# Patient Record
Sex: Female | Born: 1945 | Race: White | Hispanic: No | Marital: Married | Smoking: Never smoker
Health system: Southern US, Community
[De-identification: ages and names within clinical notes are randomized; demographics above are authoritative.]

## PROBLEM LIST (undated history)

## (undated) DIAGNOSIS — R7303 Prediabetes: Secondary | ICD-10-CM

## (undated) DIAGNOSIS — J309 Allergic rhinitis, unspecified: Secondary | ICD-10-CM

## (undated) DIAGNOSIS — E785 Hyperlipidemia, unspecified: Secondary | ICD-10-CM

## (undated) DIAGNOSIS — C4402 Squamous cell carcinoma of skin of lip: Secondary | ICD-10-CM

## (undated) DIAGNOSIS — C4361 Malignant melanoma of right upper limb, including shoulder: Secondary | ICD-10-CM

## (undated) DIAGNOSIS — F988 Other specified behavioral and emotional disorders with onset usually occurring in childhood and adolescence: Secondary | ICD-10-CM

## (undated) DIAGNOSIS — I10 Essential (primary) hypertension: Secondary | ICD-10-CM

## (undated) DIAGNOSIS — M1990 Unspecified osteoarthritis, unspecified site: Secondary | ICD-10-CM

## (undated) DIAGNOSIS — F32A Depression, unspecified: Secondary | ICD-10-CM

## (undated) HISTORY — DX: Allergic rhinitis, unspecified: J30.9

## (undated) HISTORY — PX: OTHER SURGICAL HISTORY: SHX169

## (undated) HISTORY — DX: Squamous cell carcinoma of skin of lip: C44.02

## (undated) HISTORY — PX: MELANOMA EXCISION: SHX5266

## (undated) HISTORY — DX: Malignant melanoma of right upper limb, including shoulder: C43.61

## (undated) HISTORY — PX: TONSILLECTOMY: SUR1361

---

## 1999-06-03 ENCOUNTER — Other Ambulatory Visit: Admission: RE | Admit: 1999-06-03 | Discharge: 1999-06-03 | Payer: Self-pay | Admitting: *Deleted

## 2000-06-03 ENCOUNTER — Other Ambulatory Visit: Admission: RE | Admit: 2000-06-03 | Discharge: 2000-06-03 | Payer: Self-pay | Admitting: *Deleted

## 2003-04-11 ENCOUNTER — Other Ambulatory Visit: Admission: RE | Admit: 2003-04-11 | Discharge: 2003-04-11 | Payer: Self-pay | Admitting: *Deleted

## 2004-05-15 ENCOUNTER — Other Ambulatory Visit: Admission: RE | Admit: 2004-05-15 | Discharge: 2004-05-15 | Payer: Self-pay | Admitting: *Deleted

## 2005-12-08 ENCOUNTER — Other Ambulatory Visit: Admission: RE | Admit: 2005-12-08 | Discharge: 2005-12-08 | Payer: Self-pay | Admitting: *Deleted

## 2007-03-23 ENCOUNTER — Other Ambulatory Visit: Admission: RE | Admit: 2007-03-23 | Discharge: 2007-03-23 | Payer: Self-pay | Admitting: *Deleted

## 2015-05-15 DIAGNOSIS — J309 Allergic rhinitis, unspecified: Secondary | ICD-10-CM | POA: Insufficient documentation

## 2015-05-15 DIAGNOSIS — H1010 Acute atopic conjunctivitis, unspecified eye: Secondary | ICD-10-CM

## 2015-05-15 DIAGNOSIS — H04129 Dry eye syndrome of unspecified lacrimal gland: Secondary | ICD-10-CM | POA: Insufficient documentation

## 2015-05-15 DIAGNOSIS — I10 Essential (primary) hypertension: Secondary | ICD-10-CM

## 2015-06-15 ENCOUNTER — Ambulatory Visit (INDEPENDENT_AMBULATORY_CARE_PROVIDER_SITE_OTHER): Payer: Medicare Other | Admitting: *Deleted

## 2015-06-15 DIAGNOSIS — H1010 Acute atopic conjunctivitis, unspecified eye: Secondary | ICD-10-CM | POA: Diagnosis not present

## 2015-06-15 DIAGNOSIS — J309 Allergic rhinitis, unspecified: Secondary | ICD-10-CM | POA: Diagnosis not present

## 2015-06-22 ENCOUNTER — Ambulatory Visit (INDEPENDENT_AMBULATORY_CARE_PROVIDER_SITE_OTHER): Payer: Medicare Other | Admitting: *Deleted

## 2015-06-22 DIAGNOSIS — J309 Allergic rhinitis, unspecified: Secondary | ICD-10-CM

## 2015-06-29 ENCOUNTER — Ambulatory Visit (INDEPENDENT_AMBULATORY_CARE_PROVIDER_SITE_OTHER): Payer: Medicare Other | Admitting: *Deleted

## 2015-06-29 DIAGNOSIS — J309 Allergic rhinitis, unspecified: Secondary | ICD-10-CM

## 2015-07-02 DIAGNOSIS — J3089 Other allergic rhinitis: Secondary | ICD-10-CM | POA: Diagnosis not present

## 2015-07-03 DIAGNOSIS — J301 Allergic rhinitis due to pollen: Secondary | ICD-10-CM | POA: Diagnosis not present

## 2015-07-05 ENCOUNTER — Ambulatory Visit (INDEPENDENT_AMBULATORY_CARE_PROVIDER_SITE_OTHER): Payer: Medicare Other | Admitting: *Deleted

## 2015-07-05 DIAGNOSIS — J309 Allergic rhinitis, unspecified: Secondary | ICD-10-CM | POA: Diagnosis not present

## 2015-07-13 ENCOUNTER — Ambulatory Visit (INDEPENDENT_AMBULATORY_CARE_PROVIDER_SITE_OTHER): Payer: Medicare Other | Admitting: *Deleted

## 2015-07-13 DIAGNOSIS — J309 Allergic rhinitis, unspecified: Secondary | ICD-10-CM

## 2015-07-23 ENCOUNTER — Ambulatory Visit (INDEPENDENT_AMBULATORY_CARE_PROVIDER_SITE_OTHER): Payer: Medicare Other

## 2015-07-23 DIAGNOSIS — J309 Allergic rhinitis, unspecified: Secondary | ICD-10-CM

## 2015-07-30 ENCOUNTER — Ambulatory Visit (INDEPENDENT_AMBULATORY_CARE_PROVIDER_SITE_OTHER): Payer: Medicare Other

## 2015-07-30 DIAGNOSIS — J309 Allergic rhinitis, unspecified: Secondary | ICD-10-CM

## 2015-08-06 ENCOUNTER — Ambulatory Visit (INDEPENDENT_AMBULATORY_CARE_PROVIDER_SITE_OTHER): Payer: Medicare Other | Admitting: *Deleted

## 2015-08-06 DIAGNOSIS — J309 Allergic rhinitis, unspecified: Secondary | ICD-10-CM

## 2015-08-13 ENCOUNTER — Ambulatory Visit (INDEPENDENT_AMBULATORY_CARE_PROVIDER_SITE_OTHER): Payer: Medicare Other

## 2015-08-13 DIAGNOSIS — J309 Allergic rhinitis, unspecified: Secondary | ICD-10-CM | POA: Diagnosis not present

## 2015-08-24 ENCOUNTER — Ambulatory Visit (INDEPENDENT_AMBULATORY_CARE_PROVIDER_SITE_OTHER): Payer: Medicare Other | Admitting: *Deleted

## 2015-08-24 DIAGNOSIS — J309 Allergic rhinitis, unspecified: Secondary | ICD-10-CM

## 2015-09-03 ENCOUNTER — Ambulatory Visit (INDEPENDENT_AMBULATORY_CARE_PROVIDER_SITE_OTHER): Payer: Medicare Other | Admitting: *Deleted

## 2015-09-03 DIAGNOSIS — J309 Allergic rhinitis, unspecified: Secondary | ICD-10-CM | POA: Diagnosis not present

## 2015-09-13 ENCOUNTER — Ambulatory Visit (INDEPENDENT_AMBULATORY_CARE_PROVIDER_SITE_OTHER): Payer: Medicare Other

## 2015-09-13 DIAGNOSIS — J309 Allergic rhinitis, unspecified: Secondary | ICD-10-CM

## 2015-09-28 ENCOUNTER — Ambulatory Visit (INDEPENDENT_AMBULATORY_CARE_PROVIDER_SITE_OTHER): Payer: Medicare Other | Admitting: *Deleted

## 2015-09-28 DIAGNOSIS — J309 Allergic rhinitis, unspecified: Secondary | ICD-10-CM | POA: Diagnosis not present

## 2015-10-05 ENCOUNTER — Ambulatory Visit (INDEPENDENT_AMBULATORY_CARE_PROVIDER_SITE_OTHER): Payer: Medicare Other | Admitting: *Deleted

## 2015-10-05 DIAGNOSIS — J309 Allergic rhinitis, unspecified: Secondary | ICD-10-CM | POA: Diagnosis not present

## 2015-10-22 ENCOUNTER — Ambulatory Visit (INDEPENDENT_AMBULATORY_CARE_PROVIDER_SITE_OTHER): Payer: Medicare Other | Admitting: *Deleted

## 2015-10-22 DIAGNOSIS — J309 Allergic rhinitis, unspecified: Secondary | ICD-10-CM

## 2015-10-25 DIAGNOSIS — J301 Allergic rhinitis due to pollen: Secondary | ICD-10-CM | POA: Diagnosis not present

## 2015-10-26 DIAGNOSIS — J3089 Other allergic rhinitis: Secondary | ICD-10-CM | POA: Diagnosis not present

## 2015-10-29 ENCOUNTER — Ambulatory Visit (INDEPENDENT_AMBULATORY_CARE_PROVIDER_SITE_OTHER): Payer: Medicare Other

## 2015-10-29 DIAGNOSIS — J309 Allergic rhinitis, unspecified: Secondary | ICD-10-CM

## 2015-11-04 DIAGNOSIS — J310 Chronic rhinitis: Secondary | ICD-10-CM | POA: Insufficient documentation

## 2015-11-04 DIAGNOSIS — E785 Hyperlipidemia, unspecified: Secondary | ICD-10-CM | POA: Insufficient documentation

## 2015-11-04 DIAGNOSIS — R5382 Chronic fatigue, unspecified: Secondary | ICD-10-CM | POA: Insufficient documentation

## 2015-11-04 DIAGNOSIS — F411 Generalized anxiety disorder: Secondary | ICD-10-CM | POA: Insufficient documentation

## 2015-11-04 DIAGNOSIS — G4700 Insomnia, unspecified: Secondary | ICD-10-CM | POA: Insufficient documentation

## 2015-11-04 DIAGNOSIS — M159 Polyosteoarthritis, unspecified: Secondary | ICD-10-CM | POA: Insufficient documentation

## 2015-11-08 ENCOUNTER — Ambulatory Visit (INDEPENDENT_AMBULATORY_CARE_PROVIDER_SITE_OTHER): Payer: Medicare Other | Admitting: *Deleted

## 2015-11-08 DIAGNOSIS — J309 Allergic rhinitis, unspecified: Secondary | ICD-10-CM

## 2015-11-12 ENCOUNTER — Ambulatory Visit (INDEPENDENT_AMBULATORY_CARE_PROVIDER_SITE_OTHER): Payer: Medicare Other | Admitting: *Deleted

## 2015-11-12 DIAGNOSIS — J309 Allergic rhinitis, unspecified: Secondary | ICD-10-CM

## 2015-11-22 ENCOUNTER — Ambulatory Visit (INDEPENDENT_AMBULATORY_CARE_PROVIDER_SITE_OTHER): Payer: Medicare Other | Admitting: *Deleted

## 2015-11-22 DIAGNOSIS — J309 Allergic rhinitis, unspecified: Secondary | ICD-10-CM

## 2015-12-03 ENCOUNTER — Ambulatory Visit (INDEPENDENT_AMBULATORY_CARE_PROVIDER_SITE_OTHER): Payer: Medicare Other

## 2015-12-03 DIAGNOSIS — J309 Allergic rhinitis, unspecified: Secondary | ICD-10-CM

## 2015-12-10 ENCOUNTER — Ambulatory Visit (INDEPENDENT_AMBULATORY_CARE_PROVIDER_SITE_OTHER): Payer: Medicare Other

## 2015-12-10 DIAGNOSIS — J309 Allergic rhinitis, unspecified: Secondary | ICD-10-CM | POA: Diagnosis not present

## 2015-12-20 ENCOUNTER — Ambulatory Visit (INDEPENDENT_AMBULATORY_CARE_PROVIDER_SITE_OTHER): Payer: Medicare Other | Admitting: *Deleted

## 2015-12-20 DIAGNOSIS — J309 Allergic rhinitis, unspecified: Secondary | ICD-10-CM

## 2015-12-27 ENCOUNTER — Ambulatory Visit (INDEPENDENT_AMBULATORY_CARE_PROVIDER_SITE_OTHER): Payer: Medicare Other | Admitting: *Deleted

## 2015-12-27 DIAGNOSIS — J309 Allergic rhinitis, unspecified: Secondary | ICD-10-CM

## 2016-01-03 ENCOUNTER — Ambulatory Visit (INDEPENDENT_AMBULATORY_CARE_PROVIDER_SITE_OTHER): Payer: Medicare Other | Admitting: *Deleted

## 2016-01-03 DIAGNOSIS — J309 Allergic rhinitis, unspecified: Secondary | ICD-10-CM | POA: Diagnosis not present

## 2016-01-17 ENCOUNTER — Ambulatory Visit (INDEPENDENT_AMBULATORY_CARE_PROVIDER_SITE_OTHER): Payer: Medicare Other | Admitting: *Deleted

## 2016-01-17 DIAGNOSIS — J309 Allergic rhinitis, unspecified: Secondary | ICD-10-CM | POA: Diagnosis not present

## 2016-01-31 ENCOUNTER — Ambulatory Visit (INDEPENDENT_AMBULATORY_CARE_PROVIDER_SITE_OTHER): Payer: Medicare Other | Admitting: *Deleted

## 2016-01-31 DIAGNOSIS — J309 Allergic rhinitis, unspecified: Secondary | ICD-10-CM

## 2016-02-12 DIAGNOSIS — J301 Allergic rhinitis due to pollen: Secondary | ICD-10-CM | POA: Diagnosis not present

## 2016-02-13 DIAGNOSIS — J3089 Other allergic rhinitis: Secondary | ICD-10-CM | POA: Diagnosis not present

## 2016-02-18 ENCOUNTER — Ambulatory Visit (INDEPENDENT_AMBULATORY_CARE_PROVIDER_SITE_OTHER): Payer: Medicare Other

## 2016-02-18 DIAGNOSIS — J309 Allergic rhinitis, unspecified: Secondary | ICD-10-CM

## 2016-03-03 ENCOUNTER — Ambulatory Visit (INDEPENDENT_AMBULATORY_CARE_PROVIDER_SITE_OTHER): Payer: Medicare Other

## 2016-03-03 DIAGNOSIS — J309 Allergic rhinitis, unspecified: Secondary | ICD-10-CM

## 2016-03-06 DIAGNOSIS — H04129 Dry eye syndrome of unspecified lacrimal gland: Secondary | ICD-10-CM | POA: Insufficient documentation

## 2016-03-06 DIAGNOSIS — H0100B Unspecified blepharitis left eye, upper and lower eyelids: Secondary | ICD-10-CM | POA: Insufficient documentation

## 2016-03-06 DIAGNOSIS — H0100A Unspecified blepharitis right eye, upper and lower eyelids: Secondary | ICD-10-CM | POA: Insufficient documentation

## 2016-03-06 DIAGNOSIS — H25043 Posterior subcapsular polar age-related cataract, bilateral: Secondary | ICD-10-CM | POA: Insufficient documentation

## 2016-03-20 ENCOUNTER — Ambulatory Visit (INDEPENDENT_AMBULATORY_CARE_PROVIDER_SITE_OTHER): Payer: Medicare Other | Admitting: *Deleted

## 2016-03-20 DIAGNOSIS — J309 Allergic rhinitis, unspecified: Secondary | ICD-10-CM

## 2016-04-07 ENCOUNTER — Ambulatory Visit (INDEPENDENT_AMBULATORY_CARE_PROVIDER_SITE_OTHER): Payer: Medicare Other

## 2016-04-07 DIAGNOSIS — J309 Allergic rhinitis, unspecified: Secondary | ICD-10-CM

## 2016-04-17 ENCOUNTER — Ambulatory Visit (INDEPENDENT_AMBULATORY_CARE_PROVIDER_SITE_OTHER): Payer: Medicare Other | Admitting: *Deleted

## 2016-04-17 DIAGNOSIS — J309 Allergic rhinitis, unspecified: Secondary | ICD-10-CM

## 2016-04-21 DIAGNOSIS — H25012 Cortical age-related cataract, left eye: Secondary | ICD-10-CM | POA: Insufficient documentation

## 2016-04-21 DIAGNOSIS — H2512 Age-related nuclear cataract, left eye: Secondary | ICD-10-CM | POA: Insufficient documentation

## 2016-04-24 ENCOUNTER — Ambulatory Visit (INDEPENDENT_AMBULATORY_CARE_PROVIDER_SITE_OTHER): Payer: Medicare Other | Admitting: *Deleted

## 2016-04-24 DIAGNOSIS — J309 Allergic rhinitis, unspecified: Secondary | ICD-10-CM | POA: Diagnosis not present

## 2016-05-05 ENCOUNTER — Ambulatory Visit (INDEPENDENT_AMBULATORY_CARE_PROVIDER_SITE_OTHER): Payer: Medicare Other | Admitting: *Deleted

## 2016-05-05 DIAGNOSIS — J309 Allergic rhinitis, unspecified: Secondary | ICD-10-CM

## 2016-05-05 DIAGNOSIS — H43812 Vitreous degeneration, left eye: Secondary | ICD-10-CM | POA: Insufficient documentation

## 2016-05-14 DIAGNOSIS — G2581 Restless legs syndrome: Secondary | ICD-10-CM | POA: Insufficient documentation

## 2016-05-15 ENCOUNTER — Ambulatory Visit (INDEPENDENT_AMBULATORY_CARE_PROVIDER_SITE_OTHER): Payer: Medicare Other | Admitting: *Deleted

## 2016-05-15 DIAGNOSIS — J309 Allergic rhinitis, unspecified: Secondary | ICD-10-CM | POA: Diagnosis not present

## 2016-05-29 ENCOUNTER — Ambulatory Visit (INDEPENDENT_AMBULATORY_CARE_PROVIDER_SITE_OTHER): Payer: Medicare Other | Admitting: *Deleted

## 2016-05-29 DIAGNOSIS — J309 Allergic rhinitis, unspecified: Secondary | ICD-10-CM | POA: Diagnosis not present

## 2016-06-10 DIAGNOSIS — J301 Allergic rhinitis due to pollen: Secondary | ICD-10-CM | POA: Diagnosis not present

## 2016-06-11 DIAGNOSIS — J3089 Other allergic rhinitis: Secondary | ICD-10-CM | POA: Diagnosis not present

## 2016-06-12 ENCOUNTER — Ambulatory Visit (INDEPENDENT_AMBULATORY_CARE_PROVIDER_SITE_OTHER): Payer: Medicare Other | Admitting: *Deleted

## 2016-06-12 DIAGNOSIS — J309 Allergic rhinitis, unspecified: Secondary | ICD-10-CM | POA: Diagnosis not present

## 2016-06-16 DIAGNOSIS — H0288A Meibomian gland dysfunction right eye, upper and lower eyelids: Secondary | ICD-10-CM | POA: Insufficient documentation

## 2016-06-16 DIAGNOSIS — H26493 Other secondary cataract, bilateral: Secondary | ICD-10-CM | POA: Insufficient documentation

## 2016-06-23 ENCOUNTER — Ambulatory Visit (INDEPENDENT_AMBULATORY_CARE_PROVIDER_SITE_OTHER): Payer: Medicare Other | Admitting: *Deleted

## 2016-06-23 DIAGNOSIS — J309 Allergic rhinitis, unspecified: Secondary | ICD-10-CM | POA: Diagnosis not present

## 2016-07-07 ENCOUNTER — Ambulatory Visit (INDEPENDENT_AMBULATORY_CARE_PROVIDER_SITE_OTHER): Payer: Medicare Other | Admitting: *Deleted

## 2016-07-07 DIAGNOSIS — J309 Allergic rhinitis, unspecified: Secondary | ICD-10-CM | POA: Diagnosis not present

## 2016-07-17 ENCOUNTER — Ambulatory Visit (INDEPENDENT_AMBULATORY_CARE_PROVIDER_SITE_OTHER): Payer: Medicare Other | Admitting: *Deleted

## 2016-07-17 DIAGNOSIS — J309 Allergic rhinitis, unspecified: Secondary | ICD-10-CM

## 2016-07-24 ENCOUNTER — Ambulatory Visit (INDEPENDENT_AMBULATORY_CARE_PROVIDER_SITE_OTHER): Payer: Medicare Other | Admitting: *Deleted

## 2016-07-24 DIAGNOSIS — J309 Allergic rhinitis, unspecified: Secondary | ICD-10-CM | POA: Diagnosis not present

## 2016-08-04 ENCOUNTER — Ambulatory Visit (INDEPENDENT_AMBULATORY_CARE_PROVIDER_SITE_OTHER): Payer: Medicare Other | Admitting: *Deleted

## 2016-08-04 DIAGNOSIS — J309 Allergic rhinitis, unspecified: Secondary | ICD-10-CM | POA: Diagnosis not present

## 2016-08-11 ENCOUNTER — Ambulatory Visit (INDEPENDENT_AMBULATORY_CARE_PROVIDER_SITE_OTHER): Payer: Medicare Other | Admitting: *Deleted

## 2016-08-11 DIAGNOSIS — J309 Allergic rhinitis, unspecified: Secondary | ICD-10-CM | POA: Diagnosis not present

## 2016-08-21 ENCOUNTER — Ambulatory Visit (INDEPENDENT_AMBULATORY_CARE_PROVIDER_SITE_OTHER): Payer: Medicare Other

## 2016-08-21 DIAGNOSIS — J309 Allergic rhinitis, unspecified: Secondary | ICD-10-CM

## 2016-09-04 ENCOUNTER — Ambulatory Visit (INDEPENDENT_AMBULATORY_CARE_PROVIDER_SITE_OTHER): Payer: Medicare Other | Admitting: *Deleted

## 2016-09-04 DIAGNOSIS — J309 Allergic rhinitis, unspecified: Secondary | ICD-10-CM

## 2016-09-11 ENCOUNTER — Ambulatory Visit (INDEPENDENT_AMBULATORY_CARE_PROVIDER_SITE_OTHER): Payer: Medicare Other | Admitting: *Deleted

## 2016-09-11 DIAGNOSIS — J309 Allergic rhinitis, unspecified: Secondary | ICD-10-CM | POA: Diagnosis not present

## 2016-09-25 ENCOUNTER — Ambulatory Visit (INDEPENDENT_AMBULATORY_CARE_PROVIDER_SITE_OTHER): Payer: Medicare Other | Admitting: *Deleted

## 2016-09-25 DIAGNOSIS — J309 Allergic rhinitis, unspecified: Secondary | ICD-10-CM

## 2016-10-02 DIAGNOSIS — J301 Allergic rhinitis due to pollen: Secondary | ICD-10-CM | POA: Diagnosis not present

## 2016-10-03 DIAGNOSIS — J3089 Other allergic rhinitis: Secondary | ICD-10-CM | POA: Diagnosis not present

## 2016-10-13 ENCOUNTER — Ambulatory Visit (INDEPENDENT_AMBULATORY_CARE_PROVIDER_SITE_OTHER): Payer: Medicare Other | Admitting: *Deleted

## 2016-10-13 DIAGNOSIS — J309 Allergic rhinitis, unspecified: Secondary | ICD-10-CM

## 2016-10-30 ENCOUNTER — Ambulatory Visit (INDEPENDENT_AMBULATORY_CARE_PROVIDER_SITE_OTHER): Payer: Medicare Other | Admitting: *Deleted

## 2016-10-30 DIAGNOSIS — J309 Allergic rhinitis, unspecified: Secondary | ICD-10-CM | POA: Diagnosis not present

## 2016-11-20 ENCOUNTER — Ambulatory Visit (INDEPENDENT_AMBULATORY_CARE_PROVIDER_SITE_OTHER): Payer: Medicare Other | Admitting: *Deleted

## 2016-11-20 DIAGNOSIS — J309 Allergic rhinitis, unspecified: Secondary | ICD-10-CM | POA: Diagnosis not present

## 2016-12-04 ENCOUNTER — Ambulatory Visit (INDEPENDENT_AMBULATORY_CARE_PROVIDER_SITE_OTHER): Payer: Medicare Other | Admitting: *Deleted

## 2016-12-04 DIAGNOSIS — J309 Allergic rhinitis, unspecified: Secondary | ICD-10-CM | POA: Diagnosis not present

## 2016-12-29 ENCOUNTER — Ambulatory Visit (INDEPENDENT_AMBULATORY_CARE_PROVIDER_SITE_OTHER): Payer: Medicare Other | Admitting: *Deleted

## 2016-12-29 DIAGNOSIS — J309 Allergic rhinitis, unspecified: Secondary | ICD-10-CM

## 2017-01-15 ENCOUNTER — Ambulatory Visit (INDEPENDENT_AMBULATORY_CARE_PROVIDER_SITE_OTHER): Payer: Medicare Other | Admitting: *Deleted

## 2017-01-15 DIAGNOSIS — J309 Allergic rhinitis, unspecified: Secondary | ICD-10-CM | POA: Diagnosis not present

## 2017-02-02 ENCOUNTER — Ambulatory Visit (INDEPENDENT_AMBULATORY_CARE_PROVIDER_SITE_OTHER): Payer: Medicare Other | Admitting: *Deleted

## 2017-02-02 DIAGNOSIS — J309 Allergic rhinitis, unspecified: Secondary | ICD-10-CM | POA: Diagnosis not present

## 2017-02-19 ENCOUNTER — Ambulatory Visit (INDEPENDENT_AMBULATORY_CARE_PROVIDER_SITE_OTHER): Payer: Medicare Other | Admitting: *Deleted

## 2017-02-19 DIAGNOSIS — J309 Allergic rhinitis, unspecified: Secondary | ICD-10-CM | POA: Diagnosis not present

## 2017-03-05 ENCOUNTER — Ambulatory Visit (INDEPENDENT_AMBULATORY_CARE_PROVIDER_SITE_OTHER): Payer: Medicare Other

## 2017-03-05 DIAGNOSIS — J309 Allergic rhinitis, unspecified: Secondary | ICD-10-CM | POA: Diagnosis not present

## 2017-03-19 ENCOUNTER — Ambulatory Visit (INDEPENDENT_AMBULATORY_CARE_PROVIDER_SITE_OTHER): Payer: Medicare Other

## 2017-03-19 DIAGNOSIS — J309 Allergic rhinitis, unspecified: Secondary | ICD-10-CM

## 2017-03-27 NOTE — Addendum Note (Signed)
Addended by: Berna BueWHITAKER, Morrigan Wickens L on: 03/27/2017 03:58 PM   Modules accepted: Orders

## 2017-04-06 ENCOUNTER — Ambulatory Visit (INDEPENDENT_AMBULATORY_CARE_PROVIDER_SITE_OTHER): Payer: Medicare Other

## 2017-04-06 DIAGNOSIS — J309 Allergic rhinitis, unspecified: Secondary | ICD-10-CM | POA: Diagnosis not present

## 2017-04-07 DIAGNOSIS — J3089 Other allergic rhinitis: Secondary | ICD-10-CM | POA: Diagnosis not present

## 2017-04-07 NOTE — Progress Notes (Signed)
Vials exp 04-07-18 

## 2017-04-08 DIAGNOSIS — J301 Allergic rhinitis due to pollen: Secondary | ICD-10-CM | POA: Diagnosis not present

## 2017-04-20 ENCOUNTER — Ambulatory Visit (INDEPENDENT_AMBULATORY_CARE_PROVIDER_SITE_OTHER): Payer: Medicare Other | Admitting: *Deleted

## 2017-04-20 DIAGNOSIS — J309 Allergic rhinitis, unspecified: Secondary | ICD-10-CM

## 2017-05-11 ENCOUNTER — Ambulatory Visit (INDEPENDENT_AMBULATORY_CARE_PROVIDER_SITE_OTHER): Payer: Medicare Other | Admitting: *Deleted

## 2017-05-11 DIAGNOSIS — J309 Allergic rhinitis, unspecified: Secondary | ICD-10-CM

## 2017-05-25 ENCOUNTER — Ambulatory Visit (INDEPENDENT_AMBULATORY_CARE_PROVIDER_SITE_OTHER): Payer: Medicare Other | Admitting: *Deleted

## 2017-05-25 DIAGNOSIS — J309 Allergic rhinitis, unspecified: Secondary | ICD-10-CM | POA: Diagnosis not present

## 2017-06-15 ENCOUNTER — Ambulatory Visit (INDEPENDENT_AMBULATORY_CARE_PROVIDER_SITE_OTHER): Payer: Medicare Other | Admitting: *Deleted

## 2017-06-15 DIAGNOSIS — J309 Allergic rhinitis, unspecified: Secondary | ICD-10-CM | POA: Diagnosis not present

## 2017-07-09 ENCOUNTER — Ambulatory Visit (INDEPENDENT_AMBULATORY_CARE_PROVIDER_SITE_OTHER): Payer: Medicare Other | Admitting: *Deleted

## 2017-07-09 DIAGNOSIS — J309 Allergic rhinitis, unspecified: Secondary | ICD-10-CM | POA: Diagnosis not present

## 2017-08-03 ENCOUNTER — Ambulatory Visit (INDEPENDENT_AMBULATORY_CARE_PROVIDER_SITE_OTHER): Payer: Medicare Other

## 2017-08-03 DIAGNOSIS — J309 Allergic rhinitis, unspecified: Secondary | ICD-10-CM | POA: Diagnosis not present

## 2017-08-27 ENCOUNTER — Ambulatory Visit (INDEPENDENT_AMBULATORY_CARE_PROVIDER_SITE_OTHER): Payer: Medicare Other | Admitting: *Deleted

## 2017-08-27 DIAGNOSIS — J309 Allergic rhinitis, unspecified: Secondary | ICD-10-CM | POA: Diagnosis not present

## 2017-10-19 ENCOUNTER — Ambulatory Visit (INDEPENDENT_AMBULATORY_CARE_PROVIDER_SITE_OTHER): Payer: Medicare Other | Admitting: *Deleted

## 2017-10-19 DIAGNOSIS — J309 Allergic rhinitis, unspecified: Secondary | ICD-10-CM

## 2017-10-22 DIAGNOSIS — M25552 Pain in left hip: Secondary | ICD-10-CM | POA: Insufficient documentation

## 2017-11-02 ENCOUNTER — Ambulatory Visit (INDEPENDENT_AMBULATORY_CARE_PROVIDER_SITE_OTHER): Payer: Medicare Other | Admitting: *Deleted

## 2017-11-02 DIAGNOSIS — J309 Allergic rhinitis, unspecified: Secondary | ICD-10-CM

## 2017-11-19 ENCOUNTER — Ambulatory Visit (INDEPENDENT_AMBULATORY_CARE_PROVIDER_SITE_OTHER): Payer: Medicare Other | Admitting: *Deleted

## 2017-11-19 DIAGNOSIS — J309 Allergic rhinitis, unspecified: Secondary | ICD-10-CM

## 2017-11-20 DIAGNOSIS — M7062 Trochanteric bursitis, left hip: Secondary | ICD-10-CM | POA: Insufficient documentation

## 2017-12-02 ENCOUNTER — Ambulatory Visit (INDEPENDENT_AMBULATORY_CARE_PROVIDER_SITE_OTHER): Payer: Medicare Other

## 2017-12-02 ENCOUNTER — Encounter: Payer: Self-pay | Admitting: Sports Medicine

## 2017-12-02 ENCOUNTER — Ambulatory Visit: Payer: Medicare Other | Admitting: Sports Medicine

## 2017-12-02 DIAGNOSIS — M2040 Other hammer toe(s) (acquired), unspecified foot: Secondary | ICD-10-CM | POA: Diagnosis not present

## 2017-12-02 DIAGNOSIS — M79671 Pain in right foot: Secondary | ICD-10-CM

## 2017-12-02 DIAGNOSIS — M79672 Pain in left foot: Secondary | ICD-10-CM | POA: Diagnosis not present

## 2017-12-02 NOTE — Progress Notes (Signed)
Subjective: Tricia Hayes is a 72 y.o. female patient who presents to office for evaluation of Right> Left foot pain/irritation. Patient complains of progressive changes to toe especially over the last few years even though has noticed this problem for several years slowly getting worse in the Right>Left foot at the 2-5 toes.  States that the area is not painful it is just irritating patient is unable to give pain level. Reports that she is now even getting tenderness on the ball of her foot from time to time reports that her over-the-counter and padding helps.  Patient denies any other pedal complaints.   Review of Systems  All other systems reviewed and are negative.    Patient Active Problem List   Diagnosis Date Noted  . Dry eye syndrome 05/15/2015  . SystemiC arterial hypertension 05/15/2015  . Allergic rhinoconjunctivitis 05/15/2015    Current Outpatient Medications on File Prior to Visit  Medication Sig Dispense Refill  . aspirin 81 MG chewable tablet Chew by mouth daily.    Marland Kitchen. atorvastatin (LIPITOR) 10 MG tablet Take 10 mg by mouth daily.    . bisoprolol-hydrochlorothiazide (ZIAC) 5-6.25 MG tablet Take 1 tablet by mouth daily.    Marland Kitchen. buPROPion (WELLBUTRIN XL) 300 MG 24 hr tablet Take 300 mg by mouth daily.    . cetirizine (ZYRTEC) 10 MG tablet Take 10 mg by mouth daily.    Marland Kitchen. dextromethorphan 15 MG/5ML syrup Take 10 mLs by mouth 4 (four) times daily as needed for cough.    Marland Kitchen. FLUoxetine (PROZAC) 20 MG tablet Take 20 mg by mouth daily.    . fluticasone (FLONASE) 50 MCG/ACT nasal spray Place 1 spray into both nostrils daily.    Marland Kitchen. MONTELUKAST SODIUM PO Take by mouth daily.    . Zolpidem Tartrate (AMBIEN PO) Take by mouth at bedtime.     No current facility-administered medications on file prior to visit.     Allergies  Allergen Reactions  . Penicillins     Objective:  General: Alert and oriented x3 in no acute distress  Dermatology: Minimal hyperkeratotic lesion overlying 2-5  PIPJ dorsally bilateral. No open lesions bilateral lower extremities, no webspace macerations, no ecchymosis bilateral, all nails x 10 are well manicured.  Vascular: Dorsalis Pedis and Posterior Tibial pedal pulses 1/4, Capillary Fill Time 3 seconds,(+) pedal hair growth bilateral, no edema bilateral lower extremities, Temperature gradient within normal limits.  Neurology: Michaell CowingGross sensation intact via light touch bilateral.   Musculoskeletal: Semi-flexible hammertoes 2-5 with minimal tenderness with palpation at PIPJ Right>Left. Ankle, Subtalar, Midtarsal, and MTPJ joint range of motion is within normal limits, there is no 1st ray hypermobility noted bilateral, No bunion deformity noted bilateral. No pain with calf compression bilateral.  Strength within normal limits in all groups bilateral.   Gait: Unassisted, Non-antalgic.  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       Assessment and Plan: Problem List Items Addressed This Visit    None    Visit Diagnoses    Hammer toe, unspecified laterality    -  Primary   Relevant Orders   DG Foot Complete Right   DG Foot Complete Left   Foot pain, bilateral           -Complete examination performed -Xrays reviewed -Discussed treatement options for hammertoe deformity with dislocation -Dispensed toe separator and encouraged patient to continue with good supportive shoes and gave shoe recommendation  list -Advised patient if continues to worsen or fail to get an additional relief and may benefit from surgery -Patient to return to office as needed or sooner if condition worsens.  Asencion Islam, DPM

## 2017-12-02 NOTE — Patient Instructions (Signed)
Hammer Toe Hammer toe is a change in the shape (a deformity) of your second, third, or fourth toe. The deformity causes the middle joint of your toe to stay bent. This causes pain, especially when you are wearing shoes. Hammer toe starts gradually. At first, the toe can be straightened. Gradually over time, the deformity becomes stiff and permanent. Early treatments to keep the toe straight may relieve pain. As the deformity becomes stiff and permanent, surgery may be needed to straighten the toe. What are the causes? Hammer toe is caused by abnormal bending of the toe joint that is closest to your foot. It happens gradually over time. This pulls on the muscles and connections (tendons) of the toe joint, making them weak and stiff. It is often related to wearing shoes that are too short or narrow and do not let your toes straighten. What increases the risk? You may be at greater risk for hammer toe if you:  Are female.  Are older.  Wear shoes that are too small.  Wear high-heeled shoes that pinch your toes.  Are a ballet dancer.  Have a second toe that is longer than your big toe (first toe).  Injure your foot or toe.  Have arthritis.  Have a family history of hammer toe.  Have a nerve or muscle disorder.  What are the signs or symptoms? The main symptoms of this condition are pain and deformity of the toe. The pain is worse when wearing shoes, walking, or running. Other symptoms may include:  Corns or calluses over the bent part of the toe or between the toes.  Redness and a burning feeling on the toe.  An open sore that forms on the top of the toe.  Not being able to straighten the toe.  How is this diagnosed? This condition is diagnosed based on your symptoms and a physical exam. During the exam, your health care provider will try to straighten your toe to see how stiff the deformity is. You may also have tests, such as:  A blood test to check for rheumatoid  arthritis.  An X-ray to show how severe the deformity is.  How is this treated? Treatment for this condition will depend on how stiff the deformity is. Surgery is often needed. However, sometimes a hammer toe can be straightened without surgery. Treatments that do not involve surgery include:  Taping the toe into a straightened position.  Using pads and cushions to protect the toe (orthotics).  Wearing shoes that provide enough room for the toes.  Doing toe-stretching exercises at home.  Taking an NSAID to reduce pain and swelling.  If these treatments do not help or the toe cannot be straightened, surgery is the next option. The most common surgeries used to straighten a hammer toe include:  Arthroplasty. In this procedure, part of the joint is removed, and that allows the toe to straighten.  Fusion. In this procedure, cartilage between the two bones of the joint is taken out and the bones are fused together into one longer bone.  Implantation. In this procedure, part of the bone is removed and replaced with an implant to let the toe move again.  Flexor tendon transfer. In this procedure, the tendons that curl the toes down (flexor tendons) are repositioned.  Follow these instructions at home:  Take over-the-counter and prescription medicines only as told by your health care provider.  Do toe straightening and stretching exercises as told by your health care provider.  Keep all   follow-up visits as told by your health care provider. This is important. How is this prevented?  Wear shoes that give your toes enough room and do not cause pain.  Do not wear high-heeled shoes. Contact a health care provider if:  Your pain gets worse.  Your toe becomes red or swollen.  You develop an open sore on your toe. This information is not intended to replace advice given to you by your health care provider. Make sure you discuss any questions you have with your health care  provider. Document Released: 08/29/2000 Document Revised: 03/21/2016 Document Reviewed: 12/26/2015 Elsevier Interactive Patient Education  2018 [employer]  

## 2017-12-07 ENCOUNTER — Ambulatory Visit (INDEPENDENT_AMBULATORY_CARE_PROVIDER_SITE_OTHER): Payer: Medicare Other | Admitting: *Deleted

## 2017-12-07 DIAGNOSIS — J309 Allergic rhinitis, unspecified: Secondary | ICD-10-CM

## 2017-12-28 ENCOUNTER — Ambulatory Visit (INDEPENDENT_AMBULATORY_CARE_PROVIDER_SITE_OTHER): Payer: Medicare Other | Admitting: *Deleted

## 2017-12-28 DIAGNOSIS — J309 Allergic rhinitis, unspecified: Secondary | ICD-10-CM | POA: Diagnosis not present

## 2018-01-11 ENCOUNTER — Ambulatory Visit (INDEPENDENT_AMBULATORY_CARE_PROVIDER_SITE_OTHER): Payer: Medicare Other | Admitting: *Deleted

## 2018-01-11 DIAGNOSIS — J309 Allergic rhinitis, unspecified: Secondary | ICD-10-CM | POA: Diagnosis not present

## 2018-01-12 NOTE — Progress Notes (Signed)
VIALS EXP 01-14-19 

## 2018-01-13 DIAGNOSIS — J301 Allergic rhinitis due to pollen: Secondary | ICD-10-CM | POA: Diagnosis not present

## 2018-01-14 DIAGNOSIS — J3089 Other allergic rhinitis: Secondary | ICD-10-CM | POA: Diagnosis not present

## 2018-01-25 ENCOUNTER — Ambulatory Visit (INDEPENDENT_AMBULATORY_CARE_PROVIDER_SITE_OTHER): Payer: Medicare Other | Admitting: *Deleted

## 2018-01-25 DIAGNOSIS — J309 Allergic rhinitis, unspecified: Secondary | ICD-10-CM | POA: Diagnosis not present

## 2018-02-11 ENCOUNTER — Ambulatory Visit (INDEPENDENT_AMBULATORY_CARE_PROVIDER_SITE_OTHER): Payer: Medicare Other | Admitting: *Deleted

## 2018-02-11 DIAGNOSIS — J309 Allergic rhinitis, unspecified: Secondary | ICD-10-CM

## 2018-02-24 DIAGNOSIS — F331 Major depressive disorder, recurrent, moderate: Secondary | ICD-10-CM | POA: Insufficient documentation

## 2018-03-01 DIAGNOSIS — R5381 Other malaise: Secondary | ICD-10-CM | POA: Insufficient documentation

## 2018-03-01 DIAGNOSIS — G894 Chronic pain syndrome: Secondary | ICD-10-CM | POA: Insufficient documentation

## 2018-03-03 DIAGNOSIS — R7303 Prediabetes: Secondary | ICD-10-CM | POA: Insufficient documentation

## 2018-03-04 ENCOUNTER — Ambulatory Visit (INDEPENDENT_AMBULATORY_CARE_PROVIDER_SITE_OTHER): Payer: Medicare Other | Admitting: *Deleted

## 2018-03-04 DIAGNOSIS — J309 Allergic rhinitis, unspecified: Secondary | ICD-10-CM | POA: Diagnosis not present

## 2018-03-25 ENCOUNTER — Ambulatory Visit (INDEPENDENT_AMBULATORY_CARE_PROVIDER_SITE_OTHER): Payer: Medicare Other | Admitting: *Deleted

## 2018-03-25 DIAGNOSIS — J309 Allergic rhinitis, unspecified: Secondary | ICD-10-CM

## 2018-04-16 DIAGNOSIS — F988 Other specified behavioral and emotional disorders with onset usually occurring in childhood and adolescence: Secondary | ICD-10-CM | POA: Insufficient documentation

## 2018-04-19 ENCOUNTER — Ambulatory Visit (INDEPENDENT_AMBULATORY_CARE_PROVIDER_SITE_OTHER): Payer: Medicare Other | Admitting: *Deleted

## 2018-04-19 DIAGNOSIS — J309 Allergic rhinitis, unspecified: Secondary | ICD-10-CM

## 2018-05-03 ENCOUNTER — Ambulatory Visit (INDEPENDENT_AMBULATORY_CARE_PROVIDER_SITE_OTHER): Payer: Medicare Other | Admitting: *Deleted

## 2018-05-03 DIAGNOSIS — J309 Allergic rhinitis, unspecified: Secondary | ICD-10-CM

## 2018-05-31 ENCOUNTER — Ambulatory Visit (INDEPENDENT_AMBULATORY_CARE_PROVIDER_SITE_OTHER): Payer: Medicare Other | Admitting: *Deleted

## 2018-05-31 DIAGNOSIS — J309 Allergic rhinitis, unspecified: Secondary | ICD-10-CM

## 2018-06-17 ENCOUNTER — Ambulatory Visit (INDEPENDENT_AMBULATORY_CARE_PROVIDER_SITE_OTHER): Payer: Medicare Other | Admitting: *Deleted

## 2018-06-17 DIAGNOSIS — J309 Allergic rhinitis, unspecified: Secondary | ICD-10-CM

## 2018-07-19 ENCOUNTER — Ambulatory Visit (INDEPENDENT_AMBULATORY_CARE_PROVIDER_SITE_OTHER): Payer: Medicare Other | Admitting: *Deleted

## 2018-07-19 DIAGNOSIS — J309 Allergic rhinitis, unspecified: Secondary | ICD-10-CM

## 2018-08-16 ENCOUNTER — Ambulatory Visit (INDEPENDENT_AMBULATORY_CARE_PROVIDER_SITE_OTHER): Payer: Medicare Other | Admitting: *Deleted

## 2018-08-16 DIAGNOSIS — J309 Allergic rhinitis, unspecified: Secondary | ICD-10-CM

## 2018-09-20 ENCOUNTER — Ambulatory Visit (INDEPENDENT_AMBULATORY_CARE_PROVIDER_SITE_OTHER): Payer: Medicare Other | Admitting: *Deleted

## 2018-09-20 DIAGNOSIS — J309 Allergic rhinitis, unspecified: Secondary | ICD-10-CM | POA: Diagnosis not present

## 2018-10-04 ENCOUNTER — Ambulatory Visit (INDEPENDENT_AMBULATORY_CARE_PROVIDER_SITE_OTHER): Payer: Medicare Other

## 2018-10-04 DIAGNOSIS — J309 Allergic rhinitis, unspecified: Secondary | ICD-10-CM

## 2018-10-25 ENCOUNTER — Ambulatory Visit (INDEPENDENT_AMBULATORY_CARE_PROVIDER_SITE_OTHER): Payer: Medicare Other

## 2018-10-25 DIAGNOSIS — J309 Allergic rhinitis, unspecified: Secondary | ICD-10-CM

## 2018-11-09 ENCOUNTER — Encounter: Payer: Self-pay | Admitting: *Deleted

## 2018-11-09 DIAGNOSIS — J301 Allergic rhinitis due to pollen: Secondary | ICD-10-CM | POA: Diagnosis not present

## 2018-11-09 NOTE — Progress Notes (Signed)
VIALS EXP. 11/10/2019 

## 2018-11-10 DIAGNOSIS — J3089 Other allergic rhinitis: Secondary | ICD-10-CM | POA: Diagnosis not present

## 2018-11-11 ENCOUNTER — Ambulatory Visit (INDEPENDENT_AMBULATORY_CARE_PROVIDER_SITE_OTHER): Payer: Medicare Other | Admitting: *Deleted

## 2018-11-11 DIAGNOSIS — J309 Allergic rhinitis, unspecified: Secondary | ICD-10-CM | POA: Diagnosis not present

## 2018-12-02 ENCOUNTER — Ambulatory Visit (INDEPENDENT_AMBULATORY_CARE_PROVIDER_SITE_OTHER): Payer: Medicare Other | Admitting: *Deleted

## 2018-12-02 DIAGNOSIS — J309 Allergic rhinitis, unspecified: Secondary | ICD-10-CM | POA: Diagnosis not present

## 2018-12-16 ENCOUNTER — Ambulatory Visit (INDEPENDENT_AMBULATORY_CARE_PROVIDER_SITE_OTHER): Payer: Medicare Other | Admitting: *Deleted

## 2018-12-16 DIAGNOSIS — J309 Allergic rhinitis, unspecified: Secondary | ICD-10-CM | POA: Diagnosis not present

## 2018-12-30 ENCOUNTER — Ambulatory Visit (INDEPENDENT_AMBULATORY_CARE_PROVIDER_SITE_OTHER): Payer: Medicare Other | Admitting: *Deleted

## 2018-12-30 DIAGNOSIS — J309 Allergic rhinitis, unspecified: Secondary | ICD-10-CM | POA: Diagnosis not present

## 2019-01-24 ENCOUNTER — Ambulatory Visit (INDEPENDENT_AMBULATORY_CARE_PROVIDER_SITE_OTHER): Payer: Medicare Other | Admitting: *Deleted

## 2019-01-24 DIAGNOSIS — J309 Allergic rhinitis, unspecified: Secondary | ICD-10-CM

## 2019-02-14 ENCOUNTER — Ambulatory Visit (INDEPENDENT_AMBULATORY_CARE_PROVIDER_SITE_OTHER): Payer: Medicare Other | Admitting: *Deleted

## 2019-02-14 DIAGNOSIS — J309 Allergic rhinitis, unspecified: Secondary | ICD-10-CM

## 2019-03-10 ENCOUNTER — Ambulatory Visit (INDEPENDENT_AMBULATORY_CARE_PROVIDER_SITE_OTHER): Payer: Medicare Other

## 2019-03-10 DIAGNOSIS — J309 Allergic rhinitis, unspecified: Secondary | ICD-10-CM

## 2019-03-31 ENCOUNTER — Ambulatory Visit (INDEPENDENT_AMBULATORY_CARE_PROVIDER_SITE_OTHER): Payer: Medicare Other | Admitting: *Deleted

## 2019-03-31 DIAGNOSIS — J309 Allergic rhinitis, unspecified: Secondary | ICD-10-CM

## 2019-04-28 ENCOUNTER — Ambulatory Visit (INDEPENDENT_AMBULATORY_CARE_PROVIDER_SITE_OTHER): Payer: Medicare Other | Admitting: *Deleted

## 2019-04-28 DIAGNOSIS — J309 Allergic rhinitis, unspecified: Secondary | ICD-10-CM

## 2019-05-02 DIAGNOSIS — M79641 Pain in right hand: Secondary | ICD-10-CM | POA: Insufficient documentation

## 2019-05-02 DIAGNOSIS — M659 Synovitis and tenosynovitis, unspecified: Secondary | ICD-10-CM | POA: Insufficient documentation

## 2019-05-02 DIAGNOSIS — M65941 Unspecified synovitis and tenosynovitis, right hand: Secondary | ICD-10-CM | POA: Insufficient documentation

## 2019-05-19 ENCOUNTER — Ambulatory Visit (INDEPENDENT_AMBULATORY_CARE_PROVIDER_SITE_OTHER): Payer: Medicare Other | Admitting: *Deleted

## 2019-05-19 DIAGNOSIS — J309 Allergic rhinitis, unspecified: Secondary | ICD-10-CM | POA: Diagnosis not present

## 2019-05-24 DIAGNOSIS — J301 Allergic rhinitis due to pollen: Secondary | ICD-10-CM | POA: Diagnosis not present

## 2019-05-24 NOTE — Progress Notes (Signed)
Vials exp 05-23-20 

## 2019-05-25 DIAGNOSIS — J3089 Other allergic rhinitis: Secondary | ICD-10-CM

## 2019-05-26 DIAGNOSIS — M25512 Pain in left shoulder: Secondary | ICD-10-CM | POA: Insufficient documentation

## 2019-05-26 DIAGNOSIS — M25571 Pain in right ankle and joints of right foot: Secondary | ICD-10-CM | POA: Insufficient documentation

## 2019-06-09 ENCOUNTER — Ambulatory Visit (INDEPENDENT_AMBULATORY_CARE_PROVIDER_SITE_OTHER): Payer: Medicare Other | Admitting: *Deleted

## 2019-06-09 DIAGNOSIS — J309 Allergic rhinitis, unspecified: Secondary | ICD-10-CM

## 2019-06-30 ENCOUNTER — Ambulatory Visit (INDEPENDENT_AMBULATORY_CARE_PROVIDER_SITE_OTHER): Payer: Medicare Other | Admitting: *Deleted

## 2019-06-30 DIAGNOSIS — J309 Allergic rhinitis, unspecified: Secondary | ICD-10-CM | POA: Diagnosis not present

## 2019-07-21 ENCOUNTER — Ambulatory Visit (INDEPENDENT_AMBULATORY_CARE_PROVIDER_SITE_OTHER): Payer: Medicare Other | Admitting: *Deleted

## 2019-07-21 DIAGNOSIS — J309 Allergic rhinitis, unspecified: Secondary | ICD-10-CM | POA: Diagnosis not present

## 2019-08-15 ENCOUNTER — Ambulatory Visit (INDEPENDENT_AMBULATORY_CARE_PROVIDER_SITE_OTHER): Payer: Medicare Other | Admitting: *Deleted

## 2019-08-15 DIAGNOSIS — J309 Allergic rhinitis, unspecified: Secondary | ICD-10-CM

## 2019-09-05 ENCOUNTER — Ambulatory Visit (INDEPENDENT_AMBULATORY_CARE_PROVIDER_SITE_OTHER): Payer: Medicare Other | Admitting: *Deleted

## 2019-09-05 DIAGNOSIS — J309 Allergic rhinitis, unspecified: Secondary | ICD-10-CM

## 2019-09-26 ENCOUNTER — Ambulatory Visit (INDEPENDENT_AMBULATORY_CARE_PROVIDER_SITE_OTHER): Payer: Medicare PPO | Admitting: *Deleted

## 2019-09-26 DIAGNOSIS — J309 Allergic rhinitis, unspecified: Secondary | ICD-10-CM

## 2019-10-13 ENCOUNTER — Ambulatory Visit (INDEPENDENT_AMBULATORY_CARE_PROVIDER_SITE_OTHER): Payer: Medicare PPO | Admitting: *Deleted

## 2019-10-13 DIAGNOSIS — J309 Allergic rhinitis, unspecified: Secondary | ICD-10-CM

## 2019-10-27 ENCOUNTER — Ambulatory Visit (INDEPENDENT_AMBULATORY_CARE_PROVIDER_SITE_OTHER): Payer: Medicare PPO | Admitting: *Deleted

## 2019-10-27 DIAGNOSIS — J309 Allergic rhinitis, unspecified: Secondary | ICD-10-CM | POA: Diagnosis not present

## 2019-11-17 ENCOUNTER — Ambulatory Visit (INDEPENDENT_AMBULATORY_CARE_PROVIDER_SITE_OTHER): Payer: Medicare PPO | Admitting: *Deleted

## 2019-11-17 DIAGNOSIS — J309 Allergic rhinitis, unspecified: Secondary | ICD-10-CM | POA: Diagnosis not present

## 2019-12-01 ENCOUNTER — Ambulatory Visit (INDEPENDENT_AMBULATORY_CARE_PROVIDER_SITE_OTHER): Payer: Medicare PPO | Admitting: *Deleted

## 2019-12-01 ENCOUNTER — Telehealth: Payer: Self-pay | Admitting: Allergy and Immunology

## 2019-12-01 DIAGNOSIS — J309 Allergic rhinitis, unspecified: Secondary | ICD-10-CM

## 2019-12-01 NOTE — Telephone Encounter (Signed)
Tricia Hayes received a bill for $40.  She states the bill reminded her that she needed to give [place] her new insurance.  I have scanned and entered her new insurance.  Tricia Hayes would like everything re-filed from January 1st to present under her new insurance.

## 2019-12-01 NOTE — Telephone Encounter (Signed)
Okay I got her charges filed to her new insurance.  Thank you.

## 2019-12-07 DIAGNOSIS — Z6832 Body mass index (BMI) 32.0-32.9, adult: Secondary | ICD-10-CM | POA: Insufficient documentation

## 2019-12-15 ENCOUNTER — Ambulatory Visit (INDEPENDENT_AMBULATORY_CARE_PROVIDER_SITE_OTHER): Payer: Medicare PPO | Admitting: *Deleted

## 2019-12-15 DIAGNOSIS — J309 Allergic rhinitis, unspecified: Secondary | ICD-10-CM | POA: Diagnosis not present

## 2020-01-05 ENCOUNTER — Ambulatory Visit (INDEPENDENT_AMBULATORY_CARE_PROVIDER_SITE_OTHER): Payer: Medicare PPO | Admitting: *Deleted

## 2020-01-05 DIAGNOSIS — J309 Allergic rhinitis, unspecified: Secondary | ICD-10-CM

## 2020-01-17 DIAGNOSIS — J301 Allergic rhinitis due to pollen: Secondary | ICD-10-CM

## 2020-01-17 NOTE — Progress Notes (Signed)
Vials exp 01-16-21

## 2020-01-18 DIAGNOSIS — J3089 Other allergic rhinitis: Secondary | ICD-10-CM

## 2020-01-30 ENCOUNTER — Ambulatory Visit (INDEPENDENT_AMBULATORY_CARE_PROVIDER_SITE_OTHER): Payer: Medicare PPO | Admitting: *Deleted

## 2020-01-30 DIAGNOSIS — J309 Allergic rhinitis, unspecified: Secondary | ICD-10-CM | POA: Diagnosis not present

## 2020-01-31 DIAGNOSIS — M12819 Other specific arthropathies, not elsewhere classified, unspecified shoulder: Secondary | ICD-10-CM | POA: Insufficient documentation

## 2020-02-20 ENCOUNTER — Ambulatory Visit (INDEPENDENT_AMBULATORY_CARE_PROVIDER_SITE_OTHER): Payer: Medicare PPO | Admitting: *Deleted

## 2020-02-20 DIAGNOSIS — J309 Allergic rhinitis, unspecified: Secondary | ICD-10-CM

## 2020-03-15 ENCOUNTER — Ambulatory Visit (INDEPENDENT_AMBULATORY_CARE_PROVIDER_SITE_OTHER): Payer: Medicare PPO | Admitting: *Deleted

## 2020-03-15 DIAGNOSIS — J309 Allergic rhinitis, unspecified: Secondary | ICD-10-CM | POA: Diagnosis not present

## 2020-04-19 ENCOUNTER — Ambulatory Visit (INDEPENDENT_AMBULATORY_CARE_PROVIDER_SITE_OTHER): Payer: Medicare PPO | Admitting: *Deleted

## 2020-04-19 DIAGNOSIS — J309 Allergic rhinitis, unspecified: Secondary | ICD-10-CM

## 2020-05-02 ENCOUNTER — Encounter (HOSPITAL_COMMUNITY): Payer: Self-pay

## 2020-05-02 NOTE — Progress Notes (Signed)
PCP -  Cardiologist -   PPM/ICD -  Device Orders -  Rep Notified -   Chest x-ray -  EKG -  Stress Test -  ECHO -  Cardiac Cath -   Sleep Study -  CPAP -   Fasting Blood Sugar -  Checks Blood Sugar _____ times a day  Blood Thinner Instructions: Aspirin Instructions:  ERAS Protcol - PRE-SURGERY Ensure or G2-   COVID TEST-    Anesthesia review:   Patient denies shortness of breath, fever, cough and chest pain at PAT appointment   All instructions explained to the patient, with a verbal understanding of the material. Patient agrees to go over the instructions while at home for a better understanding. Patient also instructed to self quarantine after being tested for COVID-19. The opportunity to ask questions was provided.   

## 2020-05-02 NOTE — Patient Instructions (Signed)
DUE TO COVID-19 ONLY ONE VISITOR IS ALLOWED TO COME WITH YOU AND STAY IN THE WAITING ROOM ONLY DURING PRE OP AND PROCEDURE DAY OF SURGERY. THE 1 VISITOR  MAY VISIT WITH YOU AFTER SURGERY IN YOUR PRIVATE ROOM DURING VISITING HOURS ONLY!  YOU NEED TO HAVE A COVID 19 TEST ON_______ @_______ , THIS TEST MUST BE DONE BEFORE SURGERY,  COVID TESTING SITE [address] [place] , IT IS ON THE RIGHT GOING OUT [address] APPROXIMATELY  2 MINUTES PAST [employer] ON THE RIGHT. ONCE YOUR COVID TEST IS COMPLETED,  PLEASE BEGIN THE QUARANTINE INSTRUCTIONS AS OUTLINED IN YOUR HANDOUT.                Tricia Hayes  05/02/2020   Your procedure is scheduled on:    Report to [facility] Main  Entrance   Report to admitting at AM     Call this number if you have problems the morning of surgery [phone]    Remember: NO SOLID FOOD AFTER MIDNIGHT THE NIGHT PRIOR TO SURGERY. NOTHING BY MOUTH EXCEPT CLEAR LIQUIDS UNTIL   0700 am . PLEASE FINISH G2  DRINK PER SURGEON ORDER  WHICH NEEDS TO BE COMPLETED AT         0700 am then nothing by mouth .   BRUSH YOUR TEETH MORNING OF SURGERY AND RINSE YOUR MOUTH OUT, NO CHEWING GUM CANDY OR MINTS.     Take these medicines the morning of surgery with A SIP OF WATER:                                  You may not have any metal on your body including hair pins and              piercings  Do not wear jewelry, make-up, lotions, powders or perfumes, deodorant             Do not wear nail polish on your fingernails.  Do not shave  48 hours prior to surgery.               Do not bring valuables to the hospital. [facility] IS NOT             RESPONSIBLE   FOR VALUABLES.  Contacts, dentures or bridgework may not be worn into surgery.      Patients discharged the day of surgery will not be allowed to drive home. IF YOU ARE HAVING SURGERY AND GOING HOME THE SAME DAY, YOU MUST HAVE AN ADULT TO DRIVE YOU HOME AND BE WITH YOU FOR 24  HOURS. YOU MAY GO HOME BY TAXI OR UBER OR ORTHERWISE, BUT AN ADULT MUST ACCOMPANY YOU HOME AND STAY WITH YOU FOR 24 HOURS.  Name and phone number of your driver:  Special Instructions: N/A              Please read over the following fact sheets you were given: _____________________________________________________________________             [facility] - Preparing for Surgery Before surgery, you can play an important role.  Because skin is not sterile, your skin needs to be as free of germs as possible.  You can reduce the number of germs on your skin by washing with CHG (chlorahexidine gluconate) soap before surgery.  CHG is an antiseptic cleaner which kills germs and bonds with the skin  to continue killing germs even after washing. Please DO NOT use if you have an allergy to CHG or antibacterial soaps.  If your skin becomes reddened/irritated stop using the CHG and inform your nurse when you arrive at Short Stay. Do not shave (including legs and underarms) for at least 48 hours prior to the first CHG shower.  You may shave your face/neck. Please follow these instructions carefully:  1.  Shower with CHG Soap the night before surgery and the  morning of Surgery.  2.  If you choose to wash your hair, wash your hair first as usual with your  normal  shampoo.  3.  After you shampoo, rinse your hair and body thoroughly to remove the  shampoo.                           4.  Use CHG as you would any other liquid soap.  You can apply chg directly  to the skin and wash                       Gently with a scrungie or clean washcloth.  5.  Apply the CHG Soap to your body ONLY FROM THE NECK DOWN.   Do not use on face/ open                           Wound or open sores. Avoid contact with eyes, ears mouth and genitals (private parts).                       Wash face,  Genitals (private parts) with your normal soap.             6.  Wash thoroughly, paying special attention to the area where your surgery   will be performed.  7.  Thoroughly rinse your body with warm water from the neck down.  8.  DO NOT shower/wash with your normal soap after using and rinsing off  the CHG Soap.                9.  Pat yourself dry with a clean towel.            10.  Wear clean pajamas.            11.  Place clean sheets on your bed the night of your first shower and do not  sleep with pets. Day of Surgery : Do not apply any lotions/deodorants the morning of surgery.  Please wear clean clothes to the hospital/surgery center.  FAILURE TO FOLLOW THESE INSTRUCTIONS MAY RESULT IN THE CANCELLATION OF YOUR SURGERY PATIENT SIGNATURE_________________________________  NURSE SIGNATURE__________________________________  ________________________________________________________________________ [facility]- Preparing for Total Shoulder Arthroplasty    Before surgery, you can play an important role. Because skin is not sterile, your skin needs to be as free of germs as possible. You can reduce the number of germs on your skin by using the following products. . Benzoyl Peroxide Gel o Reduces the number of germs present on the skin o Applied twice a day to shoulder area starting two days before surgery    ==================================================================  Please follow these instructions carefully:  BENZOYL PEROXIDE 5% GEL  Please do not use if you have an allergy to benzoyl peroxide.   If your skin becomes reddened/irritated stop using the benzoyl peroxide.  Starting two days before surgery, apply  as follows: 1. Apply benzoyl peroxide in the morning and at night. Apply after taking a shower. If you are not taking a shower clean entire shoulder front, back, and side along with the armpit with a clean wet washcloth.  2. Place a quarter-sized dollop on your shoulder and rub in thoroughly, making sure to cover the front, back, and side of your shoulder, along with the armpit.   2 days before ____ AM    ____ PM              1 day before ____ AM   ____ PM                         3. Do this twice a day for two days.  (Last application is the night before surgery, AFTER using the CHG soap as described below).  4. Do NOT apply benzoyl peroxide gel on the day of surgery.   Incentive Spirometer  An incentive spirometer is a tool that can help keep your lungs clear and active. This tool measures how well you are filling your lungs with each breath. Taking long deep breaths may help reverse or decrease the chance of developing breathing (pulmonary) problems (especially infection) following:  A long period of time when you are unable to move or be active. BEFORE THE PROCEDURE   If the spirometer includes an indicator to show your best effort, your nurse or respiratory therapist will set it to a desired goal.  If possible, sit up straight or lean slightly forward. Try not to slouch.  Hold the incentive spirometer in an upright position. INSTRUCTIONS FOR USE  1. Sit on the edge of your bed if possible, or sit up as far as you can in bed or on a chair. 2. Hold the incentive spirometer in an upright position. 3. Breathe out normally. 4. Place the mouthpiece in your mouth and seal your lips tightly around it. 5. Breathe in slowly and as deeply as possible, raising the piston or the ball toward the top of the column. 6. Hold your breath for 3-5 seconds or for as long as possible. Allow the piston or ball to fall to the bottom of the column. 7. Remove the mouthpiece from your mouth and breathe out normally. 8. Rest for a few seconds and repeat Steps 1 through 7 at least 10 times every 1-2 hours when you are awake. Take your time and take a few normal breaths between deep breaths. 9. The spirometer may include an indicator to show your best effort. Use the indicator as a goal to work toward during each repetition. 10. After each set of 10 deep breaths, practice coughing to be sure your lungs are clear.  If you have an incision (the cut made at the time of surgery), support your incision when coughing by placing a pillow or rolled up towels firmly against it. Once you are able to get out of bed, walk around indoors and cough well. You may stop using the incentive spirometer when instructed by your caregiver.  RISKS AND COMPLICATIONS  Take your time so you do not get dizzy or light-headed.  If you are in pain, you may need to take or ask for pain medication before doing incentive spirometry. It is harder to take a deep breath if you are having pain. AFTER USE  Rest and breathe slowly and easily.  It can be helpful to keep track of a log of your  progress. Your caregiver can provide you with a simple table to help with this. If you are using the spirometer at home, follow these instructions: [place] IF:   You are having difficultly using the spirometer.  You have trouble using the spirometer as often as instructed.  Your pain medication is not giving enough relief while using the spirometer.  You develop fever of 100.5 F (38.1 C) or higher. SEEK IMMEDIATE MEDICAL CARE IF:   You cough up bloody sputum that had not been present before.  You develop fever of 102 F (38.9 C) or greater.  You develop worsening pain at or near the incision site. MAKE SURE YOU:   Understand these instructions.  Will watch your condition.  Will get help right away if you are not doing well or get worse. Document Released: 01/12/2007 Document Revised: 11/24/2011 Document Reviewed: 03/15/2007 [facility] Patient Information 2014 [place], [place].   ________________________________________________________________________

## 2020-05-02 NOTE — H&P (Signed)
Patient's anticipated LOS is less than 2 midnights, meeting these requirements: - Younger than 83 - Lives within 1 hour of care - Has a competent adult at home to recover with post-op recover - NO history of  - Chronic pain requiring opiods  - Diabetes  - Coronary Artery Disease  - Heart failure  - Heart attack  - Stroke  - DVT/VTE  - Cardiac arrhythmia  - Respiratory Failure/COPD  - Renal failure  - Anemia  - Advanced Liver disease       Tricia Hayes is an 74 y.o. female.    Chief Complaint: left shoulder pain  HPI: Pt is a 73 y.o. female complaining of left shoulder pain for multiple years. Pain had continually increased since the beginning. X-rays in the clinic show end-stage arthritic changes of the left shoulder. Pt has tried various conservative treatments which have failed to alleviate their symptoms, including injections and therapy. Various options are discussed with the patient. Risks, benefits and expectations were discussed with the patient. Patient understand the risks, benefits and expectations and wishes to proceed with surgery.   PCP:  Gordan Payment., MD  D/C Plans: Home  PMH: No past medical history on file.  PSH: No past surgical history on file.  Social History:  has no history on file for tobacco use, alcohol use, and drug use.  Allergies:  Allergies  Allergen Reactions  . Penicillins     Medications: No current facility-administered medications for this encounter.   Current Outpatient Medications  Medication Sig Dispense Refill  . aspirin 81 MG chewable tablet Chew by mouth daily.    Marland Kitchen atorvastatin (LIPITOR) 10 MG tablet Take 10 mg by mouth daily.    . bisoprolol-hydrochlorothiazide (ZIAC) 5-6.25 MG tablet Take 1 tablet by mouth daily.    Marland Kitchen buPROPion (WELLBUTRIN XL) 300 MG 24 hr tablet Take 300 mg by mouth daily.    . cetirizine (ZYRTEC) 10 MG tablet Take 10 mg by mouth daily.    Marland Kitchen dextromethorphan 15 MG/5ML syrup Take 10 mLs by mouth  4 (four) times daily as needed for cough.    Marland Kitchen FLUoxetine (PROZAC) 20 MG tablet Take 20 mg by mouth daily.    . fluticasone (FLONASE) 50 MCG/ACT nasal spray Place 1 spray into both nostrils daily.    Marland Kitchen MONTELUKAST SODIUM PO Take by mouth daily.    . Zolpidem Tartrate (AMBIEN PO) Take by mouth at bedtime.      No results found for this or any previous visit (from the past 48 hour(s)). No results found.  ROS: Pain with rom of the left upper extremity  Physical Exam: Alert and oriented 75 y.o. female in no acute distress Cranial nerves 2-12 intact Cervical spine: full rom with no tenderness, nv intact distally Chest: active breath sounds bilaterally, no wheeze rhonchi or rales Heart: regular rate and rhythm, no murmur Abd: non tender non distended with active bowel sounds Hip is stable with rom  Left shoulder painful rom with limited strength nv intact distally No rashes or edema distally  Assessment/Plan Assessment: left shoulder cuff arthropathy  Plan:  Patient will undergo a left reverse total shoulder by Dr. Ranell Patrick at [facility] Risks benefits and expectations were discussed with the patient. Patient understand risks, benefits and expectations and wishes to proceed. Preoperative templating of the joint replacement has been completed, documented, and submitted to the Operating Room personnel in order to optimize intra-operative equipment management.   Brad Lemuel Boodram PA-C, MPAS [employer] is  now [facility]  [place] [address], [address], [place], [place] [address] Phone: [phone] [email or web address] Facebook  [employer]

## 2020-05-03 ENCOUNTER — Ambulatory Visit (INDEPENDENT_AMBULATORY_CARE_PROVIDER_SITE_OTHER): Payer: Medicare PPO | Admitting: *Deleted

## 2020-05-03 DIAGNOSIS — J309 Allergic rhinitis, unspecified: Secondary | ICD-10-CM

## 2020-05-10 ENCOUNTER — Encounter (HOSPITAL_COMMUNITY)
Admission: RE | Admit: 2020-05-10 | Discharge: 2020-05-10 | Disposition: A | Discharge status: Home / Self Care | Payer: Self-pay | Source: Ambulatory Visit | Attending: Orthopedic Surgery | Admitting: Orthopedic Surgery

## 2020-05-18 ENCOUNTER — Ambulatory Visit: Admit: 2020-05-18 | Payer: Self-pay | Admitting: Orthopedic Surgery

## 2020-05-24 ENCOUNTER — Ambulatory Visit (INDEPENDENT_AMBULATORY_CARE_PROVIDER_SITE_OTHER): Payer: Medicare PPO | Admitting: *Deleted

## 2020-05-24 DIAGNOSIS — J309 Allergic rhinitis, unspecified: Secondary | ICD-10-CM | POA: Diagnosis not present

## 2020-06-07 ENCOUNTER — Ambulatory Visit (INDEPENDENT_AMBULATORY_CARE_PROVIDER_SITE_OTHER): Payer: Medicare PPO

## 2020-06-07 DIAGNOSIS — J309 Allergic rhinitis, unspecified: Secondary | ICD-10-CM

## 2020-06-14 ENCOUNTER — Encounter (HOSPITAL_COMMUNITY): Admission: RE | Admit: 2020-06-14 | Payer: Medicare PPO | Source: Ambulatory Visit

## 2020-06-22 SURGERY — ARTHROPLASTY, SHOULDER, TOTAL, REVERSE
Anesthesia: General | Site: Shoulder | Laterality: Left

## 2020-06-28 ENCOUNTER — Ambulatory Visit (INDEPENDENT_AMBULATORY_CARE_PROVIDER_SITE_OTHER): Payer: Medicare PPO | Admitting: *Deleted

## 2020-06-28 DIAGNOSIS — J309 Allergic rhinitis, unspecified: Secondary | ICD-10-CM | POA: Diagnosis not present

## 2020-07-02 NOTE — Progress Notes (Signed)
DUE TO COVID-19 ONLY ONE VISITOR IS ALLOWED TO COME WITH YOU AND STAY IN THE WAITING ROOM ONLY DURING PRE OP AND PROCEDURE DAY OF SURGERY. THE 1 VISITOR  MAY VISIT WITH YOU AFTER SURGERY IN YOUR PRIVATE ROOM DURING VISITING HOURS ONLY!  YOU NEED TO HAVE A COVID 19 TEST ON__10/26/2021 _____ @_______ , THIS TEST MUST BE DONE BEFORE SURGERY,  COVID TESTING SITE [address] [place]  , IT IS ON THE RIGHT GOING OUT [address] APPROXIMATELY  2 MINUTES PAST [employer] ON THE RIGHT. ONCE YOUR COVID TEST IS COMPLETED,  PLEASE BEGIN THE QUARANTINE INSTRUCTIONS AS OUTLINED IN YOUR HANDOUT.                BLANDINA RENALDO  07/02/2020   Your procedure is scheduled on: 07/13/2020    Report to [facility] Main  Entrance   Report to admitting at   0730 AM     Call this number if you have problems the morning of surgery [phone]    REMEMBER: NO  SOLID FOOD CANDY OR GUM AFTER MIDNIGHT. CLEAR LIQUIDS UNTIL 0700am        . NOTHING BY MOUTH EXCEPT CLEAR LIQUIDS UNTIL    . PLEASE FINISH ENSURE DRINK PER SURGEON ORDER  WHICH NEEDS TO BE COMPLETED AT  0700am    .      CLEAR LIQUID DIET   Foods Allowed                                                                    Coffee and tea, regular and decaf                            Fruit ices (not with fruit pulp)                                      Iced Popsicles                                    Carbonated beverages, regular and diet                                    Cranberry, grape and apple juices Sports drinks like Gatorade Lightly seasoned clear broth or consume(fat free) Sugar, honey syrup ___________________________________________________________________      BRUSH YOUR TEETH MORNING OF SURGERY AND RINSE YOUR MOUTH OUT, NO CHEWING GUM CANDY OR MINTS.     Take these medicines the morning of surgery with A SIP OF WATER:   Adderall, wellbutrin, Zyrtec, Prozac, Flonase,  DO NOT TAKE ANY DIABETIC  MEDICATIONS DAY OF YOUR SURGERY                               You may not have any metal on your body including hair pins and              piercings  Do not wear jewelry, make-up, lotions, powders or perfumes, deodorant             Do not wear nail polish on your fingernails.  Do not shave  48 hours prior to surgery.              Men may shave face and neck.   Do not bring valuables to the hospital. [place].  Contacts, dentures or bridgework may not be worn into surgery.  Leave suitcase in the car. After surgery it may be brought to your room.     Patients discharged the day of surgery will not be allowed to drive home. IF YOU ARE HAVING SURGERY AND GOING HOME THE SAME DAY, YOU MUST HAVE AN ADULT TO DRIVE YOU HOME AND BE WITH YOU FOR 24 HOURS. YOU MAY GO HOME BY TAXI OR UBER OR ORTHERWISE, BUT AN ADULT MUST ACCOMPANY YOU HOME AND STAY WITH YOU FOR 24 HOURS.  Name and phone number of your driver:  Special Instructions: N/A              Please read over the following fact sheets you were given: _____________________________________________________________________  [facility] - Preparing for Surgery Before surgery, you can play an important role.  Because skin is not sterile, your skin needs to be as free of germs as possible.  You can reduce the number of germs on your skin by washing with CHG (chlorahexidine gluconate) soap before surgery.  CHG is an antiseptic cleaner which kills germs and bonds with the skin to continue killing germs even after washing. Please DO NOT use if you have an allergy to CHG or antibacterial soaps.  If your skin becomes reddened/irritated stop using the CHG and inform your nurse when you arrive at Short Stay. Do not shave (including legs and underarms) for at least 48 hours prior to the first CHG shower.  You may shave your face/neck. Please follow these instructions carefully:  1.  Shower with CHG Soap the night  before surgery and the  morning of Surgery.  2.  If you choose to wash your hair, wash your hair first as usual with your  normal  shampoo.  3.  After you shampoo, rinse your hair and body thoroughly to remove the  shampoo.                           4.  Use CHG as you would any other liquid soap.  You can apply chg directly  to the skin and wash                       Gently with a scrungie or clean washcloth.  5.  Apply the CHG Soap to your body ONLY FROM THE NECK DOWN.   Do not use on face/ open                           Wound or open sores. Avoid contact with eyes, ears mouth and genitals (private parts).                       Wash face,  Genitals (private parts) with your normal soap.             6.  Wash  thoroughly, paying special attention to the area where your surgery  will be performed.  7.  Thoroughly rinse your body with warm water from the neck down.  8.  DO NOT shower/wash with your normal soap after using and rinsing off  the CHG Soap.                9.  Pat yourself dry with a clean towel.            10.  Wear clean pajamas.            11.  Place clean sheets on your bed the night of your first shower and do not  sleep with pets. Day of Surgery : Do not apply any lotions/deodorants the morning of surgery.  Please wear clean clothes to the hospital/surgery center.  FAILURE TO FOLLOW THESE INSTRUCTIONS MAY RESULT IN THE CANCELLATION OF YOUR SURGERY PATIENT SIGNATURE_________________________________  NURSE SIGNATURE__________________________________  ________________________________________________________________________             [facility]- Preparing for Total Shoulder Arthroplasty    Before surgery, you can play an important role. Because skin is not sterile, your skin needs to be as free of germs as possible. You can reduce the number of germs on your skin by using the following products. . Benzoyl Peroxide Gel o Reduces the number of germs present on the  skin o Applied twice a day to shoulder area starting two days before surgery    ==================================================================  Please follow these instructions carefully:  BENZOYL PEROXIDE 5% GEL  Please do not use if you have an allergy to benzoyl peroxide.   If your skin becomes reddened/irritated stop using the benzoyl peroxide.  Starting two days before surgery, apply as follows: 1. Apply benzoyl peroxide in the morning and at night. Apply after taking a shower. If you are not taking a shower clean entire shoulder front, back, and side along with the armpit with a clean wet washcloth.  2. Place a quarter-sized dollop on your shoulder and rub in thoroughly, making sure to cover the front, back, and side of your shoulder, along with the armpit.   2 days before ____ AM   ____ PM              1 day before ____ AM   ____ PM                         3. Do this twice a day for two days.  (Last application is the night before surgery, AFTER using the CHG soap as described below).  4. Do NOT apply benzoyl peroxide gel on the day of surgery.

## 2020-07-04 ENCOUNTER — Encounter (HOSPITAL_COMMUNITY): Payer: Self-pay

## 2020-07-04 ENCOUNTER — Other Ambulatory Visit: Payer: Self-pay

## 2020-07-04 ENCOUNTER — Encounter (HOSPITAL_COMMUNITY)
Admission: RE | Admit: 2020-07-04 | Discharge: 2020-07-04 | Disposition: A | Discharge status: Home / Self Care | Payer: Medicare PPO | Source: Ambulatory Visit | Attending: Orthopedic Surgery | Admitting: Orthopedic Surgery

## 2020-07-04 DIAGNOSIS — Z01818 Encounter for other preprocedural examination: Secondary | ICD-10-CM | POA: Diagnosis present

## 2020-07-04 HISTORY — DX: Prediabetes: R73.03

## 2020-07-04 HISTORY — DX: Unspecified osteoarthritis, unspecified site: M19.90

## 2020-07-04 HISTORY — DX: Depression, unspecified: F32.A

## 2020-07-04 HISTORY — DX: Other specified behavioral and emotional disorders with onset usually occurring in childhood and adolescence: F98.8

## 2020-07-04 HISTORY — DX: Essential (primary) hypertension: I10

## 2020-07-04 LAB — BASIC METABOLIC PANEL
Anion gap: 8 (ref 5–15)
BUN: 16 mg/dL (ref 8–23)
CO2: 27 mmol/L (ref 22–32)
Calcium: 9 mg/dL (ref 8.9–10.3)
Chloride: 105 mmol/L (ref 98–111)
Creatinine, Ser: 0.79 mg/dL (ref 0.44–1.00)
GFR, Estimated: >60 mL/min (ref >60)
Glucose, Bld: 99 mg/dL (ref 70–99)
Potassium: 4.1 mmol/L (ref 3.5–5.1)
Sodium: 140 mmol/L (ref 135–145)

## 2020-07-04 LAB — CBC
HCT: 39.6 % (ref 36.0–46.0)
Hemoglobin: 13.3 g/dL (ref 12.0–15.0)
MCH: 31.9 pg (ref 26.0–34.0)
MCHC: 33.6 g/dL (ref 30.0–36.0)
MCV: 95 fL (ref 80.0–100.0)
Platelets: 227 10*3/uL (ref 150–400)
RBC: 4.17 MIL/uL (ref 3.87–5.11)
RDW: 12.1 % (ref 11.5–15.5)
WBC: 5.8 10*3/uL (ref 4.0–10.5)
nRBC: 0 % (ref 0.0–0.2)

## 2020-07-04 LAB — SURGICAL PCR SCREEN
MRSA, PCR: NEGATIVE
Staphylococcus aureus: NEGATIVE

## 2020-07-05 ENCOUNTER — Encounter (HOSPITAL_COMMUNITY): Payer: Self-pay

## 2020-07-05 NOTE — Progress Notes (Signed)
Anesthesia Review:  PCP: DR Shary Decamp Clearance on chart dated 04/11/2020 with OV of 04/11/2020   Cardiologist : Chest x-ray : EKG : 04/11/2020  Echo : Stress test: Cardiac Cath :  Activity level:  Sleep Study/ CPAP : Fasting Blood Sugar :      / Checks Blood Sugar -- times a day:   Blood Thinner/ Instructions /Last Dose: ASA / Instructions/ Last Dose :  HGBA1c- with labs of office visit dated 04/11/20 of 5.6

## 2020-07-10 ENCOUNTER — Other Ambulatory Visit (HOSPITAL_COMMUNITY)
Admission: RE | Admit: 2020-07-10 | Discharge: 2020-07-10 | Disposition: A | Discharge status: Home / Self Care | Payer: Medicare PPO | Source: Ambulatory Visit | Attending: Orthopedic Surgery | Admitting: Orthopedic Surgery

## 2020-07-10 DIAGNOSIS — Z20822 Contact with and (suspected) exposure to covid-19: Secondary | ICD-10-CM | POA: Insufficient documentation

## 2020-07-10 DIAGNOSIS — Z01812 Encounter for preprocedural laboratory examination: Secondary | ICD-10-CM | POA: Diagnosis present

## 2020-07-10 LAB — SARS CORONAVIRUS 2 (TAT 6-24 HRS): SARS Coronavirus 2: NEGATIVE

## 2020-07-10 NOTE — H&P (Signed)
Patient's anticipated LOS is less than 2 midnights, meeting these requirements: - Younger than 69 - Lives within 1 hour of care - Has a competent adult at home to recover with post-op recover - NO history of  - Chronic pain requiring opiods  - Diabetes  - Coronary Artery Disease  - Heart failure  - Heart attack  - Stroke  - DVT/VTE  - Cardiac arrhythmia  - Respiratory Failure/COPD  - Renal failure  - Anemia  - Advanced Liver disease       Tricia Hayes is an 74 y.o. female.    Chief Complaint: left shoulder pain  HPI: Pt is a 74 y.o. female complaining of left shoulder pain for multiple years. Pain had continually increased since the beginning. X-rays in the clinic show end-stage arthritic changes of the left shoulder. Pt has tried various conservative treatments which have failed to alleviate their symptoms, including injections and therapy. Various options are discussed with the patient. Risks, benefits and expectations were discussed with the patient. Patient understand the risks, benefits and expectations and wishes to proceed with surgery.   PCP:  Gordan Payment., MD  D/C Plans: Home  PMH: Past Medical History:  Diagnosis Date  . ADD (attention deficit disorder)   . Arthritis   . Depression   . Hypertension   . Pre-diabetes     PSH: Past Surgical History:  Procedure Laterality Date  . bilateral joinit replacement on both thumbs     . right knee arthroscopy     . TONSILLECTOMY      Social History:  reports that she has never smoked. She has never used smokeless tobacco. She reports current alcohol use. No history on file for drug use.  Allergies:  Allergies  Allergen Reactions  . Penicillins Swelling, Rash and Other (See Comments)    Mouth/tongue    Medications: No current facility-administered medications for this encounter.   Current Outpatient Medications  Medication Sig Dispense Refill  . amphetamine-dextroamphetamine (ADDERALL XR) 15 MG 24  hr capsule Take 15 mg by mouth daily as needed (focus/attention).     Marland Kitchen amphetamine-dextroamphetamine (ADDERALL XR) 5 MG 24 hr capsule Take 5 mg by mouth daily as needed (focus/attention).     Marland Kitchen aspirin EC 81 MG tablet Take 81 mg by mouth every other day. Swallow whole.     Marland Kitchen atorvastatin (LIPITOR) 10 MG tablet Take 10 mg by mouth at bedtime.     . bisoprolol-hydrochlorothiazide (ZIAC) 5-6.25 MG tablet Take 1 tablet by mouth daily.    Marland Kitchen buPROPion (WELLBUTRIN XL) 300 MG 24 hr tablet Take 300 mg by mouth daily.    . cetirizine (ZYRTEC) 10 MG tablet Take 10 mg by mouth daily.    . Flaxseed, Linseed, (FLAX SEED OIL PO) Take 1,400 mg by mouth at bedtime.    Marland Kitchen FLUoxetine (PROZAC) 20 MG capsule Take 20 mg by mouth daily.    . fluticasone (FLONASE) 50 MCG/ACT nasal spray Place 1 spray into both nostrils daily.    Marland Kitchen gabapentin (NEURONTIN) 100 MG capsule Take 200 mg by mouth at bedtime.     . meloxicam (MOBIC) 7.5 MG tablet Take 7.5 mg by mouth daily as needed for pain.    . montelukast (SINGULAIR) 10 MG tablet Take 10 mg by mouth daily.    . Multiple Vitamin (MULTIVITAMIN WITH MINERALS) TABS tablet Take 1 tablet by mouth at bedtime. Centrum Silver    . NON FORMULARY Place 1 drop into both eyes in the  morning, at noon, in the evening, and at bedtime. Autologous Serum Therapy    . Propylene Glycol (SYSTANE COMPLETE) 0.6 % SOLN Place 1 drop into both eyes 4 (four) times daily as needed (severe dry eyes.).    Marland Kitchen tiZANidine (ZANAFLEX) 4 MG tablet Take 4 mg by mouth 3 (three) times daily as needed (neck spasms).     . Ubiquinol 100 MG CAPS Take 100 mg by mouth daily.    Marland Kitchen zolpidem (AMBIEN) 10 MG tablet Take 10 mg by mouth at bedtime.       No results found for this or any previous visit (from the past 48 hour(s)). No results found.  ROS: Pain with rom of the left upper extremity  Physical Exam: Alert and oriented 74 y.o. female in no acute distress Cranial nerves 2-12 intact Cervical spine: full rom  with no tenderness, nv intact distally Chest: active breath sounds bilaterally, no wheeze rhonchi or rales Heart: regular rate and rhythm, no murmur Abd: non tender non distended with active bowel sounds Hip is stable with rom  Left shoulder with painful and weak rom nv intact distally No rashes or edema distally   Assessment/Plan Assessment: left shoulder cuff arthropathy  Plan:  Patient will undergo a left reverse total shoulder by Dr. Ranell Patrick at [facility] Risks benefits and expectations were discussed with the patient. Patient understand risks, benefits and expectations and wishes to proceed. Preoperative templating of the joint replacement has been completed, documented, and submitted to the Operating Room personnel in order to optimize intra-operative equipment management.   Alphonsa Overall PA-C, MPAS [facility] is now [employer] [address], [address], [place], [place] [address] Phone: [phone] [email or web address] Facebook  [employer]

## 2020-07-13 ENCOUNTER — Ambulatory Visit (HOSPITAL_COMMUNITY): Payer: Medicare PPO | Admitting: Physician Assistant

## 2020-07-13 ENCOUNTER — Encounter (HOSPITAL_COMMUNITY)
Admission: RE | Disposition: A | Discharge status: Home / Self Care | Payer: Self-pay | Source: Home / Self Care | Attending: Orthopedic Surgery

## 2020-07-13 ENCOUNTER — Ambulatory Visit (HOSPITAL_COMMUNITY): Payer: Medicare PPO | Admitting: Certified Registered Nurse Anesthetist

## 2020-07-13 ENCOUNTER — Other Ambulatory Visit: Payer: Self-pay

## 2020-07-13 ENCOUNTER — Encounter (HOSPITAL_COMMUNITY): Payer: Self-pay | Admitting: Orthopedic Surgery

## 2020-07-13 ENCOUNTER — Observation Stay (HOSPITAL_COMMUNITY)
Admission: RE | Admit: 2020-07-13 | Discharge: 2020-07-14 | Disposition: A | Discharge status: Home / Self Care | Payer: Medicare PPO | Attending: Orthopedic Surgery | Admitting: Orthopedic Surgery

## 2020-07-13 ENCOUNTER — Observation Stay (HOSPITAL_COMMUNITY): Payer: Medicare PPO

## 2020-07-13 DIAGNOSIS — M19012 Primary osteoarthritis, left shoulder: Secondary | ICD-10-CM | POA: Insufficient documentation

## 2020-07-13 DIAGNOSIS — Z96612 Presence of left artificial shoulder joint: Secondary | ICD-10-CM

## 2020-07-13 DIAGNOSIS — Z7902 Long term (current) use of antithrombotics/antiplatelets: Secondary | ICD-10-CM | POA: Diagnosis not present

## 2020-07-13 DIAGNOSIS — M75102 Unspecified rotator cuff tear or rupture of left shoulder, not specified as traumatic: Secondary | ICD-10-CM | POA: Diagnosis not present

## 2020-07-13 DIAGNOSIS — I10 Essential (primary) hypertension: Secondary | ICD-10-CM | POA: Insufficient documentation

## 2020-07-13 DIAGNOSIS — Z79899 Other long term (current) drug therapy: Secondary | ICD-10-CM | POA: Diagnosis not present

## 2020-07-13 DIAGNOSIS — M25512 Pain in left shoulder: Secondary | ICD-10-CM | POA: Diagnosis present

## 2020-07-13 DIAGNOSIS — Z7982 Long term (current) use of aspirin: Secondary | ICD-10-CM | POA: Diagnosis not present

## 2020-07-13 HISTORY — PX: REVERSE SHOULDER ARTHROPLASTY: SHX5054

## 2020-07-13 SURGERY — ARTHROPLASTY, SHOULDER, TOTAL, REVERSE
Anesthesia: Regional | Site: Shoulder | Laterality: Left

## 2020-07-13 MED ORDER — BISACODYL 10 MG RE SUPP: 10.0000 mg | Freq: Every day | RECTAL | Status: DC | PRN

## 2020-07-13 MED ORDER — ONDANSETRON HCL 4 MG PO TABS: 4.0000 mg | ORAL_TABLET | Freq: Three times a day (TID) | ORAL | 1 refills | Status: AC | PRN

## 2020-07-13 MED ORDER — MONTELUKAST SODIUM 10 MG PO TABS
10.0000 mg | ORAL_TABLET | Freq: Every day | ORAL | Status: DC
  Administered 2020-07-13: 10 mg via ORAL
  Filled 2020-07-13: qty 1

## 2020-07-13 MED ORDER — LORATADINE 10 MG PO TABS
10.0000 mg | ORAL_TABLET | Freq: Every day | ORAL | Status: DC
  Administered 2020-07-13: 10 mg via ORAL
  Filled 2020-07-13: qty 1

## 2020-07-13 MED ORDER — ZOLPIDEM TARTRATE 10 MG PO TABS: 5.0000 mg | ORAL_TABLET | Freq: Every evening | ORAL | Status: DC | PRN

## 2020-07-13 MED ORDER — MIDAZOLAM HCL 2 MG/2ML IJ SOLN
1.0000 mg | INTRAMUSCULAR | Status: DC
  Administered 2020-07-13: 2 mg via INTRAVENOUS
  Filled 2020-07-13: qty 2

## 2020-07-13 MED ORDER — CLINDAMYCIN PHOSPHATE 600 MG/50ML IV SOLN
600.0000 mg | Freq: Four times a day (QID) | INTRAVENOUS | Status: AC
  Administered 2020-07-13 – 2020-07-14 (×3): 600 mg via INTRAVENOUS
  Filled 2020-07-13 (×3): qty 50

## 2020-07-13 MED ORDER — CLINDAMYCIN PHOSPHATE 900 MG/50ML IV SOLN
900.0000 mg | INTRAVENOUS | Status: AC
  Administered 2020-07-13: 900 mg via INTRAVENOUS
  Filled 2020-07-13: qty 50

## 2020-07-13 MED ORDER — METOCLOPRAMIDE HCL 5 MG PO TABS: 5.0000 mg | ORAL_TABLET | Freq: Three times a day (TID) | ORAL | Status: DC | PRN

## 2020-07-13 MED ORDER — OXYCODONE-ACETAMINOPHEN 5-325 MG PO TABS
1.0000 | ORAL_TABLET | ORAL | 0 refills | Status: AC | PRN
Start: 2020-07-13 — End: 2021-07-13

## 2020-07-13 MED ORDER — POLYETHYLENE GLYCOL 3350 17 G PO PACK: 17.0000 g | PACK | Freq: Every day | ORAL | Status: DC | PRN

## 2020-07-13 MED ORDER — STERILE WATER FOR IRRIGATION IR SOLN
Status: DC | PRN
  Administered 2020-07-13: 2000 mL

## 2020-07-13 MED ORDER — BUPROPION HCL ER (XL) 300 MG PO TB24
300.0000 mg | ORAL_TABLET | Freq: Every day | ORAL | Status: DC
  Administered 2020-07-13: 300 mg via ORAL
  Filled 2020-07-13: qty 1

## 2020-07-13 MED ORDER — POLYVINYL ALCOHOL 1.4 % OP SOLN
1.0000 [drp] | Freq: Four times a day (QID) | OPHTHALMIC | Status: DC | PRN
  Filled 2020-07-13: qty 15

## 2020-07-13 MED ORDER — 0.9 % SODIUM CHLORIDE (POUR BTL) OPTIME
TOPICAL | Status: DC | PRN
  Administered 2020-07-13: 1000 mL

## 2020-07-13 MED ORDER — EPHEDRINE SULFATE-NACL 50-0.9 MG/10ML-% IV SOSY
PREFILLED_SYRINGE | INTRAVENOUS | Status: DC | PRN
  Administered 2020-07-13 (×2): 20 mg via INTRAVENOUS

## 2020-07-13 MED ORDER — DEXAMETHASONE SODIUM PHOSPHATE 10 MG/ML IJ SOLN
INTRAMUSCULAR | Status: AC
  Filled 2020-07-13: qty 1

## 2020-07-13 MED ORDER — PROPOFOL 10 MG/ML IV BOLUS
INTRAVENOUS | Status: AC
  Filled 2020-07-13: qty 20

## 2020-07-13 MED ORDER — BUPIVACAINE HCL (PF) 0.5 % IJ SOLN
INTRAMUSCULAR | Status: DC | PRN
  Administered 2020-07-13: 15 mL via PERINEURAL

## 2020-07-13 MED ORDER — LIP MEDEX EX OINT
TOPICAL_OINTMENT | CUTANEOUS | Status: AC
  Filled 2020-07-13: qty 7

## 2020-07-13 MED ORDER — PHENOL 1.4 % MT LIQD: 1.0000 | OROMUCOSAL | Status: DC | PRN

## 2020-07-13 MED ORDER — BISOPROLOL-HYDROCHLOROTHIAZIDE 5-6.25 MG PO TABS
1.0000 | ORAL_TABLET | Freq: Every day | ORAL | Status: DC
  Administered 2020-07-13: 1 via ORAL
  Filled 2020-07-13 (×2): qty 1

## 2020-07-13 MED ORDER — DEXAMETHASONE SODIUM PHOSPHATE 10 MG/ML IJ SOLN
INTRAMUSCULAR | Status: DC | PRN
  Administered 2020-07-13: 5 mg

## 2020-07-13 MED ORDER — BUPIVACAINE-EPINEPHRINE (PF) 0.25% -1:200000 IJ SOLN
INTRAMUSCULAR | Status: AC
  Filled 2020-07-13: qty 30

## 2020-07-13 MED ORDER — ORAL CARE MOUTH RINSE: 15.0000 mL | Freq: Once | OROMUCOSAL | Status: AC

## 2020-07-13 MED ORDER — MENTHOL 3 MG MT LOZG: 1.0000 | LOZENGE | OROMUCOSAL | Status: DC | PRN

## 2020-07-13 MED ORDER — TIZANIDINE HCL 4 MG PO TABS
4.0000 mg | ORAL_TABLET | Freq: Three times a day (TID) | ORAL | Status: DC | PRN
  Administered 2020-07-13: 4 mg via ORAL
  Filled 2020-07-13: qty 1

## 2020-07-13 MED ORDER — ATORVASTATIN CALCIUM 10 MG PO TABS
10.0000 mg | ORAL_TABLET | Freq: Every day | ORAL | Status: DC
  Administered 2020-07-13: 10 mg via ORAL
  Filled 2020-07-13: qty 1

## 2020-07-13 MED ORDER — UBIQUINOL 100 MG PO CAPS: 100.0000 mg | ORAL_CAPSULE | Freq: Every day | ORAL | Status: DC

## 2020-07-13 MED ORDER — ADULT MULTIVITAMIN W/MINERALS CH
1.0000 | ORAL_TABLET | Freq: Every day | ORAL | Status: DC
  Administered 2020-07-13: 1 via ORAL
  Filled 2020-07-13: qty 1

## 2020-07-13 MED ORDER — BISOPROLOL FUMARATE 5 MG PO TABS
5.0000 mg | ORAL_TABLET | Freq: Once | ORAL | Status: AC
  Administered 2020-07-13: 5 mg via ORAL
  Filled 2020-07-13: qty 1

## 2020-07-13 MED ORDER — ONDANSETRON HCL 4 MG/2ML IJ SOLN: 4.0000 mg | Freq: Four times a day (QID) | INTRAMUSCULAR | Status: DC | PRN

## 2020-07-13 MED ORDER — AMPHETAMINE-DEXTROAMPHET ER 5 MG PO CP24: 5.0000 mg | ORAL_CAPSULE | Freq: Every day | ORAL | Status: DC | PRN

## 2020-07-13 MED ORDER — AMPHETAMINE-DEXTROAMPHET ER 15 MG PO CP24: 15.0000 mg | ORAL_CAPSULE | Freq: Every day | ORAL | Status: DC | PRN

## 2020-07-13 MED ORDER — HYDROMORPHONE HCL 1 MG/ML IJ SOLN: 0.5000 mg | INTRAMUSCULAR | Status: DC | PRN

## 2020-07-13 MED ORDER — DEXAMETHASONE SODIUM PHOSPHATE 10 MG/ML IJ SOLN
INTRAMUSCULAR | Status: DC | PRN
  Administered 2020-07-13: 4 mg via INTRAVENOUS

## 2020-07-13 MED ORDER — BUPIVACAINE-EPINEPHRINE (PF) 0.25% -1:200000 IJ SOLN
INTRAMUSCULAR | Status: DC | PRN
  Administered 2020-07-13: 15 mL

## 2020-07-13 MED ORDER — PROPOFOL 10 MG/ML IV BOLUS
INTRAVENOUS | Status: DC | PRN
  Administered 2020-07-13: 150 mg via INTRAVENOUS

## 2020-07-13 MED ORDER — OXYCODONE HCL 5 MG PO TABS
5.0000 mg | ORAL_TABLET | ORAL | Status: DC | PRN
  Administered 2020-07-13: 10 mg via ORAL
  Filled 2020-07-13: qty 2

## 2020-07-13 MED ORDER — FLUOXETINE HCL 20 MG PO CAPS
20.0000 mg | ORAL_CAPSULE | Freq: Every day | ORAL | Status: DC
  Administered 2020-07-13: 20 mg via ORAL
  Filled 2020-07-13: qty 1

## 2020-07-13 MED ORDER — ONDANSETRON HCL 4 MG PO TABS
4.0000 mg | ORAL_TABLET | Freq: Four times a day (QID) | ORAL | Status: DC | PRN
  Administered 2020-07-14: 4 mg via ORAL
  Filled 2020-07-13: qty 1

## 2020-07-13 MED ORDER — GABAPENTIN 100 MG PO CAPS
200.0000 mg | ORAL_CAPSULE | Freq: Every day | ORAL | Status: DC
  Administered 2020-07-13: 200 mg via ORAL
  Filled 2020-07-13: qty 2

## 2020-07-13 MED ORDER — BUPIVACAINE LIPOSOME 1.3 % IJ SUSP
INTRAMUSCULAR | Status: DC | PRN
  Administered 2020-07-13: 10 mL via PERINEURAL

## 2020-07-13 MED ORDER — SUCCINYLCHOLINE CHLORIDE 200 MG/10ML IV SOSY
PREFILLED_SYRINGE | INTRAVENOUS | Status: AC
  Filled 2020-07-13: qty 20

## 2020-07-13 MED ORDER — ONDANSETRON HCL 4 MG/2ML IJ SOLN
INTRAMUSCULAR | Status: DC | PRN
  Administered 2020-07-13: 4 mg via INTRAVENOUS

## 2020-07-13 MED ORDER — MELOXICAM 7.5 MG PO TABS
7.5000 mg | ORAL_TABLET | Freq: Every day | ORAL | Status: DC
  Filled 2020-07-13: qty 1

## 2020-07-13 MED ORDER — FENTANYL CITRATE (PF) 100 MCG/2ML IJ SOLN
50.0000 ug | INTRAMUSCULAR | Status: DC
  Administered 2020-07-13: 50 ug via INTRAVENOUS
  Filled 2020-07-13: qty 2

## 2020-07-13 MED ORDER — LACTATED RINGERS IV SOLN
INTRAVENOUS | Status: DC
  Administered 2020-07-13: 1000 mL via INTRAVENOUS

## 2020-07-13 MED ORDER — ACETAMINOPHEN 325 MG PO TABS
325.0000 mg | ORAL_TABLET | Freq: Four times a day (QID) | ORAL | Status: DC | PRN
  Administered 2020-07-13 – 2020-07-14 (×2): 650 mg via ORAL
  Filled 2020-07-13 (×2): qty 2

## 2020-07-13 MED ORDER — FENTANYL CITRATE (PF) 100 MCG/2ML IJ SOLN
INTRAMUSCULAR | Status: DC | PRN
  Administered 2020-07-13: 25 ug via INTRAVENOUS
  Administered 2020-07-13: 50 ug via INTRAVENOUS
  Administered 2020-07-13: 25 ug via INTRAVENOUS

## 2020-07-13 MED ORDER — LIDOCAINE 2% (20 MG/ML) 5 ML SYRINGE
INTRAMUSCULAR | Status: DC | PRN
  Administered 2020-07-13: 40 mg via INTRAVENOUS

## 2020-07-13 MED ORDER — CHLORHEXIDINE GLUCONATE 0.12 % MT SOLN
15.0000 mL | Freq: Once | OROMUCOSAL | Status: AC
  Administered 2020-07-13: 15 mL via OROMUCOSAL

## 2020-07-13 MED ORDER — LIDOCAINE 2% (20 MG/ML) 5 ML SYRINGE
INTRAMUSCULAR | Status: AC
  Filled 2020-07-13: qty 5

## 2020-07-13 MED ORDER — ASPIRIN EC 81 MG PO TBEC
81.0000 mg | DELAYED_RELEASE_TABLET | ORAL | Status: DC
  Administered 2020-07-13: 81 mg via ORAL
  Filled 2020-07-13: qty 1

## 2020-07-13 MED ORDER — THROMBIN (RECOMBINANT) 5000 UNITS EX SOLR
CUTANEOUS | Status: AC
  Filled 2020-07-13: qty 5000

## 2020-07-13 MED ORDER — DOCUSATE SODIUM 100 MG PO CAPS
100.0000 mg | ORAL_CAPSULE | Freq: Two times a day (BID) | ORAL | Status: DC
  Administered 2020-07-13 (×2): 100 mg via ORAL
  Filled 2020-07-13 (×2): qty 1

## 2020-07-13 MED ORDER — METOCLOPRAMIDE HCL 5 MG/ML IJ SOLN: 5.0000 mg | Freq: Three times a day (TID) | INTRAMUSCULAR | Status: DC | PRN

## 2020-07-13 MED ORDER — SODIUM CHLORIDE 0.9 % IV SOLN: INTRAVENOUS | Status: DC

## 2020-07-13 MED ORDER — THROMBIN 5000 UNITS EX SOLR
CUTANEOUS | Status: DC | PRN
  Administered 2020-07-13: 5000 [IU] via TOPICAL

## 2020-07-13 MED ORDER — SUCCINYLCHOLINE CHLORIDE 200 MG/10ML IV SOSY
PREFILLED_SYRINGE | INTRAVENOUS | Status: DC | PRN
  Administered 2020-07-13: 100 mg via INTRAVENOUS

## 2020-07-13 MED ORDER — FENTANYL CITRATE (PF) 100 MCG/2ML IJ SOLN
INTRAMUSCULAR | Status: AC
  Filled 2020-07-13: qty 2

## 2020-07-13 MED ORDER — FLUTICASONE PROPIONATE 50 MCG/ACT NA SUSP
1.0000 | Freq: Every day | NASAL | Status: DC
  Filled 2020-07-13: qty 16

## 2020-07-13 MED ORDER — PHENYLEPHRINE HCL-NACL 20-0.9 MG/250ML-% IV SOLN
INTRAVENOUS | Status: DC | PRN
  Administered 2020-07-13: 50 ug/min via INTRAVENOUS

## 2020-07-13 MED ORDER — METHOCARBAMOL 500 MG PO TABS: 500.0000 mg | ORAL_TABLET | Freq: Four times a day (QID) | ORAL | 1 refills | Status: DC | PRN

## 2020-07-13 MED ORDER — ONDANSETRON HCL 4 MG/2ML IJ SOLN
INTRAMUSCULAR | Status: AC
  Filled 2020-07-13: qty 2

## 2020-07-13 SURGICAL SUPPLY — 78 items
AID PSTN UNV HD RSTRNT DISP (MISCELLANEOUS) ×1
BAG SPEC THK2 15X12 ZIP CLS (MISCELLANEOUS)
BAG ZIPLOCK 12X15 (MISCELLANEOUS) IMPLANT
BIT DRILL 1.6MX128 (BIT) IMPLANT
BIT DRILL 1.6MX128MM (BIT)
BIT DRILL 170X2.5X (BIT) IMPLANT
BIT DRL 170X2.5X (BIT) ×1
BLADE SAG 18X100X1.27 (BLADE) ×3 IMPLANT
CLOSURE WOUND 1/2 X4 (GAUZE/BANDAGES/DRESSINGS) ×1
COVER BACK TABLE 60X90IN (DRAPES) ×3 IMPLANT
COVER SURGICAL LIGHT HANDLE (MISCELLANEOUS) ×3 IMPLANT
COVER WAND RF STERILE (DRAPES) IMPLANT
DECANTER SPIKE VIAL GLASS SM (MISCELLANEOUS) ×3 IMPLANT
DRAPE INCISE IOBAN 66X45 STRL (DRAPES) ×3 IMPLANT
DRAPE ORTHO SPLIT 77X108 STRL (DRAPES) ×6
DRAPE SHEET LG 3/4 BI-LAMINATE (DRAPES) ×3 IMPLANT
DRAPE SURG ORHT 6 SPLT 77X108 (DRAPES) ×2 IMPLANT
DRAPE TOP 10253 STERILE (DRAPES) ×3 IMPLANT
DRAPE U-SHAPE 47X51 STRL (DRAPES) ×3 IMPLANT
DRILL 2.5 (BIT) ×3
DRSG ADAPTIC 3X8 NADH LF (GAUZE/BANDAGES/DRESSINGS) ×3 IMPLANT
DRSG PAD ABDOMINAL 8X10 ST (GAUZE/BANDAGES/DRESSINGS) ×3 IMPLANT
DURAPREP 26ML APPLICATOR (WOUND CARE) ×3 IMPLANT
ELECT BLADE TIP CTD 4 INCH (ELECTRODE) ×3 IMPLANT
ELECT NDL TIP 2.8 STRL (NEEDLE) ×1 IMPLANT
ELECT NEEDLE TIP 2.8 STRL (NEEDLE) ×3 IMPLANT
ELECT REM PT RETURN 15FT ADLT (MISCELLANEOUS) ×3 IMPLANT
EPI LT SZ 1 (Orthopedic Implant) ×3 IMPLANT
EPIPHYSIS LT SZ 1 (Orthopedic Implant) IMPLANT
FACESHIELD WRAPAROUND (MASK) ×3 IMPLANT
FACESHIELD WRAPAROUND OR TEAM (MASK) ×1 IMPLANT
GAUZE SPONGE 4X4 12PLY STRL (GAUZE/BANDAGES/DRESSINGS) ×3 IMPLANT
GLENOSPHERE DELTA XTEND LAT 38 (Miscellaneous) ×2 IMPLANT
GLOVE BIOGEL PI ORTHO PRO 7.5 (GLOVE) ×2
GLOVE BIOGEL PI ORTHO PRO SZ8 (GLOVE) ×2
GLOVE ORTHO TXT STRL SZ7.5 (GLOVE) ×3 IMPLANT
GLOVE PI ORTHO PRO STRL 7.5 (GLOVE) ×1 IMPLANT
GLOVE PI ORTHO PRO STRL SZ8 (GLOVE) ×1 IMPLANT
GLOVE SURG ORTHO 8.5 STRL (GLOVE) ×3 IMPLANT
GOWN STRL REUS W/TWL XL LVL3 (GOWN DISPOSABLE) ×6 IMPLANT
KIT BASIN OR (CUSTOM PROCEDURE TRAY) ×3 IMPLANT
KIT TURNOVER KIT A (KITS) IMPLANT
MANIFOLD NEPTUNE II (INSTRUMENTS) ×3 IMPLANT
METAGLENE DELTA EXTEND (Trauma) IMPLANT
METAGLENE DXTEND (Trauma) ×3 IMPLANT
NDL MAYO CATGUT SZ4 TPR NDL (NEEDLE) IMPLANT
NEEDLE MAYO CATGUT SZ4 (NEEDLE) IMPLANT
NS IRRIG 1000ML POUR BTL (IV SOLUTION) ×3 IMPLANT
PACK SHOULDER (CUSTOM PROCEDURE TRAY) ×3 IMPLANT
PENCIL SMOKE EVACUATOR (MISCELLANEOUS) IMPLANT
PIN GUIDE 1.2 (PIN) ×2 IMPLANT
PIN GUIDE GLENOPHERE 1.5MX300M (PIN) ×2 IMPLANT
PIN METAGLENE 2.5 (PIN) ×2 IMPLANT
PROTECTOR NERVE ULNAR (MISCELLANEOUS) ×3 IMPLANT
RESTRAINT HEAD UNIVERSAL NS (MISCELLANEOUS) ×3 IMPLANT
SCREW 4.5X18MM (Screw) ×3 IMPLANT
SCREW 4.5X24MM (Screw) ×3 IMPLANT
SCREW 4.5X36MM (Screw) ×2 IMPLANT
SCREW BN 18X4.5XSTRL SHLDR (Screw) IMPLANT
SCREW BN 24X4.5XLCK STRL (Screw) IMPLANT
SLING ARM FOAM STRAP LRG (SOFTGOODS) ×2 IMPLANT
SMARTMIX MINI TOWER (MISCELLANEOUS)
SPACER 38 PLUS 3 (Spacer) ×2 IMPLANT
SPONGE LAP 4X18 RFD (DISPOSABLE) IMPLANT
STEM DELTA DIA 10 HA (Stem) ×2 IMPLANT
STRIP CLOSURE SKIN 1/2X4 (GAUZE/BANDAGES/DRESSINGS) ×2 IMPLANT
SUCTION FRAZIER HANDLE 10FR (MISCELLANEOUS) ×3
SUCTION TUBE FRAZIER 10FR DISP (MISCELLANEOUS) ×1 IMPLANT
SUT FIBERWIRE #2 38 T-5 BLUE (SUTURE) ×6
SUT MNCRL AB 4-0 PS2 18 (SUTURE) ×3 IMPLANT
SUT VIC AB 0 CT1 36 (SUTURE) ×6 IMPLANT
SUT VIC AB 0 CT2 27 (SUTURE) ×3 IMPLANT
SUT VIC AB 2-0 CT1 27 (SUTURE) ×3
SUT VIC AB 2-0 CT1 TAPERPNT 27 (SUTURE) ×1 IMPLANT
SUTURE FIBERWR #2 38 T-5 BLUE (SUTURE) ×2 IMPLANT
TAPE CLOTH SURG 4X10 WHT LF (GAUZE/BANDAGES/DRESSINGS) ×2 IMPLANT
TOWEL OR 17X26 10 PK STRL BLUE (TOWEL DISPOSABLE) ×3 IMPLANT
TOWER SMARTMIX MINI (MISCELLANEOUS) IMPLANT

## 2020-07-13 NOTE — Plan of Care (Signed)

## 2020-07-13 NOTE — Op Note (Signed)
NAME: Tricia Hayes, WHITACRE MEDICAL RECORD [id number] ACCOUNT [id number] DATE OF BIRTH:25-Aug-1946 FACILITY: [facility] LOCATION: [facility] PHYSICIAN:STEVEN Russ Halo, MD  OPERATIVE REPORT  DATE OF PROCEDURE:  07/13/2020  PREOPERATIVE DIAGNOSIS:  Left shoulder rotator cuff tear arthropathy.  POSTOPERATIVE DIAGNOSIS:  Left shoulder rotator cuff tear arthropathy.  PROCEDURE PERFORMED:  Left reverse total shoulder replacement with no subscap repair.  PROCEDURE:  DePuy Delta Xtend prosthesis.  ATTENDING SURGEON:  Malon Kindle, MD  ASSISTANT:  Modesto Charon, [place], who was scrubbed during the entire procedure and necessary for satisfactory completion of surgery.  ANESTHESIA:  General anesthesia was used plus interscalene block.  ESTIMATED BLOOD LOSS:  150 mL.  FLUID REPLACEMENT:  1500 mL crystalloid.  INSTRUMENT COUNTS:  Correct.  COMPLICATIONS:  No complications.  ANTIBIOTICS:  Perioperative antibiotics were given.  INDICATIONS:  The patient is a 74 year old female with worsening left shoulder pain and dysfunction secondary to rotator cuff tear arthropathy.  The patient has had a failure of conservative management over an extended period of time and presents for  operative treatment to restore function and eliminate pain to the shoulder.  Informed consent obtained.  DESCRIPTION OF PROCEDURE:  After an adequate level of anesthesia was achieved, the patient was positioned in the modified beach chair position.  Left shoulder correctly identified and sterilely prepped and draped in the usual manner.  Timeout called  verifying correct patient, correct site.  We entered the patient's shoulder using a standard deltopectoral incision starting at the coracoid process extending down to the anterior humerus dissection down through subcutaneous tissues using needle tip  Bovie.  We identified the cephalic vein and took that laterally with the deltoid, pectoralis taken medially.  Conjoined tendon  identified and retracted medially.  Deep retractors were placed.  Biceps was tenodesed in situ with 0 Vicryl figure-of-eight  suture x2.  We went ahead and released the subscapularis remnant and tagged with a FiberWire to retract the axillary nerve.  Inferior capsule was released.  The shoulder was externally rotated and extended and delivered out of the wound.  We entered the  proximal humerus with a 6 mm reamer, reaming up to a size 10.  We placed our 10 mm T-handle guide and resected the humeral head with the head resection guide set on 10 degrees of retroversion with an oscillating saw.  We removed excess osteophytes with a  rongeur.  We then subluxed the humerus posteriorly and gained good exposure of the glenoid face.  We removed the biceps and the labrum and the capsule.  We were careful to protect the axillary nerve inferiorly.  We then drilled our guide pin centered on  the glenoid face.  We then reamed for the metaglene baseplate.  We drilled out the central peg hole.  We then impacted the metaglene with good bony support.  We placed a 36 screw inferiorly and then a 24 screw superiorly and then an 18 nonlocked screw  posteriorly.  We locked the superior and inferior screws.  The baseplate security was excellent.  We placed a 38 standard glenosphere onto the metaglene baseplate and secured that.  I did a finger sweep to make sure no soft tissue was incorporated  between the baseplate and the glenosphere.  We irrigated thoroughly, and then we went ahead and finished our preparation on the humeral side with a 1 left metaphyseal reamer.  We then impacted the 10 stem with a 1 left metaphysis set on 0 setting and  impacted in 10 degrees of retroversion.  We used a 38, +3 poly trial and reduced the shoulder.  We were happy with our soft tissue balancing and stability.  We removed the trial components from the humeral side, irrigating thoroughly.  We then used  available bone graft in impaction grafting  technique and impacted the HA coated 10 stem with a 1 left metaphysis set on the 0 setting and placed in 10 degrees of retroversion.  Once that was in place and stable, we selected a 38, +3 poly, impacted on the  humeral tray and reduced the shoulder again nice and stable.  The conjoined tendon appropriately tensioned.  No gaping with inferior pole or with external rotation.  We irrigated again and then resected the subscap remnant.  We then went ahead and  repaired the deltopectoral interval with 0 Vicryl suture followed by 2-0 Vicryl for subcutaneous closure and 4-0 Monocryl for skin.  Steri-Strips applied followed by sterile dressing.  The patient tolerated surgery well.  IN/NUANCE  D:07/13/2020 T:07/13/2020 JOB:[id number]

## 2020-07-13 NOTE — Anesthesia Procedure Notes (Signed)
Anesthesia Regional Block: Interscalene brachial plexus block   Pre-Anesthetic Checklist: ,, timeout performed, Correct Patient, Correct Site, Correct Laterality, Correct Procedure, Correct Position, site marked, Risks and benefits discussed,  Surgical consent,  Pre-op evaluation,  At surgeon's request and post-op pain management  Laterality: Left  Prep: Dura Prep       Needles:  Injection technique: Single-shot  Needle Type: Echogenic Stimulator Needle     Needle Length: 4cm  Needle Gauge: 20     Additional Needles:   Procedures:,,,, ultrasound used (permanent image in chart),,,,  Narrative:  Start time: 07/13/2020 8:45 AM End time: 07/13/2020 8:52 AM Injection made incrementally with aspirations every 5 mL.  Performed by: Personally  Anesthesiologist: Atilano Median, DO  Additional Notes: Patient identified. Risks/Benefits/Options discussed with patient including but not limited to bleeding, infection, nerve damage, failed block, incomplete pain control. Patient expressed understanding and wished to proceed. All questions were answered. Sterile technique was used throughout the entire procedure. Please see nursing notes for vital signs. Aspirated in 5cc intervals with injection for negative confirmation. Patient was given instructions on fall risk and not to get out of bed. All questions and concerns addressed with instructions to call with any issues or inadequate analgesia.

## 2020-07-13 NOTE — Progress Notes (Signed)
Assisted Dr. Greg Stoltzfus with left, ultrasound guided, interscalene  block. Side rails up, monitors on throughout procedure. See vital signs in flow sheet. Tolerated Procedure well. 

## 2020-07-13 NOTE — Anesthesia Preprocedure Evaluation (Signed)
Anesthesia Evaluation  Patient identified by MRN, date of birth, ID band Patient awake    Reviewed: Patient's Chart, lab work & pertinent test results, reviewed documented beta blocker date and time   Airway Mallampati: II  TM Distance: >3 FB Neck ROM: Full    Dental  (+) Teeth Intact   Pulmonary neg pulmonary ROS,    Pulmonary exam normal        Cardiovascular hypertension, Pt. on medications and Pt. on home beta blockers  Rhythm:Regular Rate:Normal     Neuro/Psych Depression negative neurological ROS     GI/Hepatic negative GI ROS, Neg liver ROS,   Endo/Other  Pre diabetic  Renal/GU negative Renal ROS  negative genitourinary   Musculoskeletal  (+) Arthritis ,   Abdominal (+)  Abdomen: soft. Bowel sounds: normal.  Peds  (+) ATTENTION DEFICIT DISORDER WITHOUT HYPERACTIVITY Hematology negative hematology ROS (+)   Anesthesia Other Findings   Reproductive/Obstetrics                             Anesthesia Physical Anesthesia Plan  ASA: II  Anesthesia Plan: General and Regional   Post-op Pain Management:  Regional for Post-op pain   Induction: Intravenous  PONV Risk Score and Plan: 3 and Ondansetron, Dexamethasone, Midazolam and Treatment may vary due to age or medical condition  Airway Management Planned: Mask and Oral ETT  Additional Equipment: None  Intra-op Plan:   Post-operative Plan: Extubation in OR  Informed Consent: I have reviewed the patients History and Physical, chart, labs and discussed the procedure including the risks, benefits and alternatives for the proposed anesthesia with the patient or authorized representative who has indicated his/her understanding and acceptance.     Dental advisory given  Plan Discussed with: CRNA  Anesthesia Plan Comments: (Lab Results      Component                Value               Date                      WBC                       5.8                 07/04/2020                HGB                      13.3                07/04/2020                HCT                      39.6                07/04/2020                MCV                      95.0                07/04/2020                PLT  227                 07/04/2020          )        Anesthesia Quick Evaluation

## 2020-07-13 NOTE — Brief Op Note (Signed)
07/13/2020  11:27 AM  PATIENT:  Tricia Hayes  74 y.o. female  PRE-OPERATIVE DIAGNOSIS:  Left shoulder rotator cuff tear arthropathy  POST-OPERATIVE DIAGNOSIS:  Left shoulder rotator cuff tear arthropathy  PROCEDURE:  Procedure(s): REVERSE SHOULDER ARTHROPLASTY (Left) DePuy Delta Xtend  SURGEON:  Surgeon(s) and Role:    Beverely Low, MD - Primary  PHYSICIAN ASSISTANT:   ASSISTANTS: Thea Gist PA-C   ANESTHESIA:   regional and general  EBL:  200 mL   BLOOD ADMINISTERED:none  DRAINS: none   LOCAL MEDICATIONS USED:  MARCAINE     SPECIMEN:  No Specimen  DISPOSITION OF SPECIMEN:  N/A  COUNTS:  YES  TOURNIQUET:  * No tourniquets in log *  DICTATION: .Other Dictation: Dictation Number [id number]  PLAN OF CARE: Admit for overnight observation  PATIENT DISPOSITION:  PACU - hemodynamically stable.   Delay start of Pharmacological VTE agent (>24hrs) due to surgical blood loss or risk of bleeding: not applicable

## 2020-07-13 NOTE — Discharge Instructions (Signed)
Ice to the shoulder constantly.  Keep the incision covered and clean and dry for one week, then ok to get it wet in the shower. ° °Do exercise as instructed several times per day. ° °DO NOT reach behind your back or push up out of a chair with the operative arm. ° °Use a sling while you are up and around for comfort, may remove while seated.  Keep pillow propped behind the operative elbow. ° °Follow up with Dr Finlee Concepcion in two weeks in the office, call [phone] for appt °

## 2020-07-13 NOTE — Anesthesia Procedure Notes (Signed)
Procedure Name: Intubation Date/Time: 07/13/2020 10:00 AM Performed by: West Pugh, CRNA Pre-anesthesia Checklist: Patient identified, Emergency Drugs available, Suction available, Patient being monitored and Timeout performed Patient Re-evaluated:Patient Re-evaluated prior to induction Oxygen Delivery Method: Circle system utilized Preoxygenation: Pre-oxygenation with 100% oxygen Induction Type: IV induction Ventilation: Mask ventilation without difficulty Laryngoscope Size: Mac and 3 Grade View: Grade I Tube type: Oral Tube size: 7.0 mm Number of attempts: 1 Airway Equipment and Method: Stylet Placement Confirmation: ETT inserted through vocal cords under direct vision,  positive ETCO2,  CO2 detector and breath sounds checked- equal and bilateral Secured at: 22 cm Tube secured with: Tape Dental Injury: Teeth and Oropharynx as per pre-operative assessment

## 2020-07-13 NOTE — Transfer of Care (Signed)
Immediate Anesthesia Transfer of Care Note  Patient: Tricia Hayes  Procedure(s) Performed: REVERSE SHOULDER ARTHROPLASTY (Left Shoulder)  Patient Location: PACU  Anesthesia Type:GA combined with regional for post-op pain  Level of Consciousness: awake, alert , oriented and patient cooperative  Airway & Oxygen Therapy: Patient Spontanous Breathing and Patient connected to face mask oxygen  Post-op Assessment: Report given to RN and Post -op Vital signs reviewed and stable  Post vital signs: Reviewed and stable  Last Vitals:  Vitals Value Taken Time  BP 131/71 07/13/20 1137  Temp    Pulse 82 07/13/20 1140  Resp 17 07/13/20 1140  SpO2 100 % 07/13/20 1140  Vitals shown include unvalidated device data.  Last Pain:  Vitals:   07/13/20 0806  TempSrc: Oral  PainSc: 0-No pain      Patients Stated Pain Goal: 4 (07/13/20 0806)  Complications: No complications documented.

## 2020-07-13 NOTE — Interval H&P Note (Signed)
History and Physical Interval Note:  07/13/2020 9:17 AM  Tricia Hayes  has presented today for surgery, with the diagnosis of Left shoulder rotator cuff tear arthropathy.  The various methods of treatment have been discussed with the patient and family. After consideration of risks, benefits and other options for treatment, the patient has consented to  Procedure(s): REVERSE SHOULDER ARTHROPLASTY (Left) as a surgical intervention.  The patient's history has been reviewed, patient examined, no change in status, stable for surgery.  I have reviewed the patient's chart and labs.  Questions were answered to the patient's satisfaction.     [name]

## 2020-07-13 NOTE — Anesthesia Postprocedure Evaluation (Signed)
Anesthesia Post Note  Patient: Tricia Hayes  Procedure(s) Performed: REVERSE SHOULDER ARTHROPLASTY (Left Shoulder)     Patient location during evaluation: PACU Anesthesia Type: Regional and General Level of consciousness: awake and alert Pain management: pain level controlled Vital Signs Assessment: post-procedure vital signs reviewed and stable Respiratory status: spontaneous breathing, nonlabored ventilation, respiratory function stable and patient connected to nasal cannula oxygen Cardiovascular status: blood pressure returned to baseline and stable Postop Assessment: no apparent nausea or vomiting Anesthetic complications: no   No complications documented.  Last Vitals:  Vitals:   07/13/20 1343 07/13/20 1439  BP: 138/76 120/72  Pulse: 83 79  Resp: 16 16  Temp: 36.7 C 36.8 C  SpO2: 95% 98%    Last Pain:  Vitals:   07/13/20 1439  TempSrc: Oral  PainSc:                  [name]

## 2020-07-14 DIAGNOSIS — M75102 Unspecified rotator cuff tear or rupture of left shoulder, not specified as traumatic: Secondary | ICD-10-CM | POA: Diagnosis not present

## 2020-07-14 LAB — BASIC METABOLIC PANEL
Anion gap: 7 (ref 5–15)
BUN: 16 mg/dL (ref 8–23)
CO2: 27 mmol/L (ref 22–32)
Calcium: 8.9 mg/dL (ref 8.9–10.3)
Chloride: 102 mmol/L (ref 98–111)
Creatinine, Ser: 0.7 mg/dL (ref 0.44–1.00)
GFR, Estimated: >60 mL/min (ref >60)
Glucose, Bld: 129 mg/dL — ABNORMAL HIGH (ref 70–99)
Potassium: 3.9 mmol/L (ref 3.5–5.1)
Sodium: 136 mmol/L (ref 135–145)

## 2020-07-14 LAB — HEMOGLOBIN AND HEMATOCRIT, BLOOD
HCT: 32.1 % — ABNORMAL LOW (ref 36.0–46.0)
Hemoglobin: 10.8 g/dL — ABNORMAL LOW (ref 12.0–15.0)

## 2020-07-14 NOTE — Progress Notes (Signed)
Patient discharging in stable condition. No further needs at this time. Discharge instructions given and reviewed with patient and husband.

## 2020-07-14 NOTE — Plan of Care (Signed)

## 2020-07-14 NOTE — Progress Notes (Signed)
Orthopedics Progress Note  Subjective: Comfortable this morning, block still working  Objective:  Vitals:   07/14/20 0141 07/14/20 0554  BP: (!) 83/58 133/86  Pulse: 78 74  Resp: 16 16  Temp: 97.8 F (36.6 C) 98.8 F (37.1 C)  SpO2: 97% 94%    General: Awake and alert  Musculoskeletal: left shoulder dressing changed, incision looks good Neurovascularly intact  Lab Results  Component Value Date   WBC 5.8 07/04/2020   HGB 10.8 (L) 07/14/2020   HCT 32.1 (L) 07/14/2020   MCV 95.0 07/04/2020   PLT 227 07/04/2020       Component Value Date/Time   NA 136 07/14/2020 0321   K 3.9 07/14/2020 0321   CL 102 07/14/2020 0321   CO2 27 07/14/2020 0321   GLUCOSE 129 (H) 07/14/2020 0321   BUN 16 07/14/2020 0321   CREATININE 0.70 07/14/2020 0321   CALCIUM 8.9 07/14/2020 0321   GFRNONAA >60 07/14/2020 0321    No results found for: INR, PROTIME  Assessment/Plan: POD #1 s/p Procedure(s): REVERSE SHOULDER ARTHROPLASTY Discharge to home after therapy Follow up in two weeks  Almedia Balls. Ranell Patrick, MD 07/14/2020 7:04 AM

## 2020-07-14 NOTE — Progress Notes (Signed)
Occupational Therapy Treatment Patient Details Name: Tricia Hayes MRN: [id number] DOB: 1946/03/29 Today's Date: 07/14/2020    History of present illness REVERSE SHOULDER ARTHROPLASTY- LEFT   OT comments  Husband present. Pt ask OT to return to provide education to spouse.    Follow Up Recommendations  Follow surgeon's recommendation for DC plan and follow-up therapies    Equipment Recommendations  None recommended by OT    Recommendations for Other Services      Precautions / Restrictions Precautions Precautions: Shoulder Type of Shoulder Precautions: REVERSE SHOULDER ARTHROPLASTY Shoulder Interventions: Shoulder sling/immobilizer Precaution Comments: elbow, wrist and hand AROM ok as well as FF 0-90, ER 0-30 and abduction 0-60. Required Braces or Orthoses: Sling Restrictions LUE Weight Bearing: Non weight bearing       Mobility Bed Mobility Overal bed mobility: Modified Independent             General bed mobility comments: pt in chair  Transfers Overall transfer level: Modified independent                        ADL either performed or assessed with clinical judgement   ADL Overall ADL's : Needs assistance/impaired                     Lower Body Dressing: Minimal assistance;Sit to/from stand;Cueing for safety;Cueing for sequencing   Toilet Transfer: Minimal assistance;Ambulation;Cueing for sequencing;Cueing for safety   Toileting- Clothing Manipulation and Hygiene: Minimal assistance;Sit to/from stand         General ADL Comments: education provided with pt and husband regarding ADL's and HEP.  Pt and husband verbalized understanding.               Cognition Arousal/Alertness: Awake/alert Behavior During Therapy: WFL for tasks assessed/performed Overall Cognitive Status: Within Functional Limits for tasks assessed                                          Exercises Shoulder Exercises Pendulum Exercise:  AAROM;Left;10 reps Elbow Flexion: 10 reps;AAROM;Left Elbow Extension: AAROM;10 reps;Left Wrist Flexion: AROM;10 reps;Left Wrist Extension: AROM;10 reps;Left Digit Composite Flexion: AROM;10 reps;Left Composite Extension: AROM;10 reps;Left   Shoulder Instructions Shoulder Instructions Donning/doffing shirt without moving shoulder: Minimal assistance;Patient able to independently direct caregiver Method for sponge bathing under operated UE: Minimal assistance;Patient able to independently direct caregiver Donning/doffing sling/immobilizer: Minimal assistance;Patient able to independently direct caregiver Correct positioning of sling/immobilizer: Minimal assistance;Patient able to independently direct caregiver Pendulum exercises (written home exercise program): Minimal assistance;Patient able to independently direct caregiver ROM for elbow, wrist and digits of operated UE: Minimal assistance;Patient able to independently direct caregiver Sling wearing schedule (on at all times/off for ADL's): Supervision/safety;Patient able to independently direct caregiver Proper positioning of operated UE when showering: Minimal assistance;Patient able to independently direct caregiver Positioning of UE while sleeping: Minimal assistance;Patient able to independently direct caregiver     General Comments      Pertinent Vitals/ Pain       Pain Assessment: No/denies pain Pain Score: 3  Pain Location: generalized from being in bed Pain Descriptors / Indicators: Sore Pain Intervention(s): Monitored during session  Home Living Family/patient expects to be discharged to:: Private residence Living Arrangements: Spouse/significant other   Type of Home: House       Home Layout: One level  Home Equipment: None                OT Goals(current goals can now be found in the care plan section)     Acute Rehab OT Goals Patient Stated Goal: home today OT Goal Formulation: With  patient  Plan         AM-PAC OT "6 Clicks" Daily Activity     Outcome Measure   Help from another person eating meals?: A Little Help from another person taking care of personal grooming?: A Little Help from another person toileting, which includes using toliet, bedpan, or urinal?: A Little Help from another person bathing (including washing, rinsing, drying)?: A Little Help from another person to put on and taking off regular upper body clothing?: A Little Help from another person to put on and taking off regular lower body clothing?: A Little 6 Click Score: 18       Activity Tolerance Patient tolerated treatment well   Patient Left in chair;with call bell/phone within reach   Nurse Communication Other (comment)        Time: 1140-1150 OT Time Calculation (min): 10 min  Charges: OT General Charges $OT Visit: 1 Visit OT Evaluation $OT Eval Moderate Complexity: 1 Mod OT Treatments $Self Care/Home Management : 8-22 mins  Lise Auer, OT Acute Rehabilitation Services [phone] Office- [phone]      Brooke Steinhilber, Karin Golden D 07/14/2020, 12:45 PM

## 2020-07-14 NOTE — Evaluation (Signed)
Occupational Therapy Evaluation Patient Details Name: ARAYNA ILLESCAS MRN: [id number] DOB: 1946/07/22 Today's Date: 07/14/2020    History of Present Illness REVERSE SHOULDER ARTHROPLASTY- LEFT   Clinical Impression   Pt admitted for shoulder sx Pt currently with functional limitations due to the deficits listed below (see OT Problem List).  Pt will benefit from skilled OT to increase their safety and independence with ADL and functional mobility for ADL to facilitate discharge to venue listed below.    Husband will A pt at home as needed    Follow Up Recommendations  Follow surgeon's recommendation for DC plan and follow-up therapies    Equipment Recommendations  None recommended by OT    Recommendations for Other Services       Precautions / Restrictions Precautions Precautions: Shoulder Type of Shoulder Precautions: REVERSE SHOULDER ARTHROPLASTY Shoulder Interventions: Shoulder sling/immobilizer Precaution Comments: elbow, wrist and hand AROM ok as well as FF 0-90, ER 0-30 and abduction 0-60. Required Braces or Orthoses: Sling Restrictions Weight Bearing Restrictions: Yes LUE Weight Bearing: Non weight bearing      Mobility Bed Mobility Overal bed mobility: Modified Independent                  Transfers Overall transfer level: Modified independent                        ADL either performed or assessed with clinical judgement     Pertinent Vitals/Pain Pain Assessment: 0-10 Pain Score: 3  Pain Location: generalized from being in bed Pain Descriptors / Indicators: Sore Pain Intervention(s): Monitored during session     Hand Dominance     Extremity/Trunk Assessment             Communication Communication Communication: No difficulties   Cognition Arousal/Alertness: Awake/alert Behavior During Therapy: WFL for tasks assessed/performed Overall Cognitive Status: Within Functional Limits for tasks assessed                                         Exercises Shoulder Exercises Pendulum Exercise: AAROM;Left;10 reps Elbow Flexion: 10 reps;AAROM;Left Elbow Extension: AAROM;10 reps;Left Wrist Flexion: AROM;10 reps;Left Wrist Extension: AROM;10 reps;Left Digit Composite Flexion: AROM;10 reps;Left Composite Extension: AROM;10 reps;Left   Shoulder Instructions Shoulder Instructions Donning/doffing shirt without moving shoulder: Minimal assistance;Patient able to independently direct caregiver Method for sponge bathing under operated UE: Minimal assistance;Patient able to independently direct caregiver Donning/doffing sling/immobilizer: Minimal assistance;Patient able to independently direct caregiver Correct positioning of sling/immobilizer: Minimal assistance;Patient able to independently direct caregiver Pendulum exercises (written home exercise program): Minimal assistance;Patient able to independently direct caregiver ROM for elbow, wrist and digits of operated UE: Minimal assistance;Patient able to independently direct caregiver Sling wearing schedule (on at all times/off for ADL's): Supervision/safety;Patient able to independently direct caregiver Proper positioning of operated UE when showering: Minimal assistance;Patient able to independently direct caregiver Positioning of UE while sleeping: Minimal assistance;Patient able to independently direct caregiver    Home Living Family/patient expects to be discharged to:: Private residence Living Arrangements: Spouse/significant other   Type of Home: House       Home Layout: One level               Home Equipment: None           OT Goals(Current goals can be found in the care plan section) Acute Rehab OT Goals Patient Stated Goal:  home today OT Goal Formulation: With patient  OT Frequency:      AM-PAC OT "6 Clicks" Daily Activity     Outcome Measure Help from another person eating meals?: A Little Help from another person taking care  of personal grooming?: A Little Help from another person toileting, which includes using toliet, bedpan, or urinal?: A Little Help from another person bathing (including washing, rinsing, drying)?: A Little Help from another person to put on and taking off regular upper body clothing?: A Little Help from another person to put on and taking off regular lower body clothing?: A Little 6 Click Score: 18   End of Session Nurse Communication: Other (comment)  Activity Tolerance: Patient tolerated treatment well Patient left: in chair;with call bell/phone within reach                   Time: 3361-2244 OT Time Calculation (min): 24 min Charges:  OT General Charges $OT Visit: 1 Visit OT Evaluation $OT Eval Moderate Complexity: 1 Mod OT Treatments $Self Care/Home Management : 8-22 mins  Lise Auer, OT Acute Rehabilitation Services [phone] Office- [phone]     Sima Lindenberger, Karin Golden D 07/14/2020, 10:43 AM

## 2020-07-14 NOTE — Discharge Summary (Signed)
In most cases prophylactic antibiotics for Dental procdeures after total joint surgery are not necessary.  Exceptions are as follows:  1. History of prior total joint infection  2. Severely immunocompromised (Organ Transplant, cancer chemotherapy, Rheumatoid biologic meds such as Humera)  3. Poorly controlled diabetes (A1C &gt; 8.0, blood glucose over 200)  If you have one of these conditions, contact your surgeon for an antibiotic prescription, prior to your dental procedure. Orthopedic Discharge Summary        Physician Discharge Summary  Patient ID: Tricia Hayes MRN: [id number] DOB/AGE: 1946/04/02 74 y.o.  Admit date: 07/13/2020 Discharge date: 07/14/2020   Procedures:  Procedure(s) (LRB): REVERSE SHOULDER ARTHROPLASTY (Left)  Attending Physician:  Dr. Malon Kindle  Admission Diagnoses:   Left shoulder end staged OA  Discharge Diagnoses:  same   Past Medical History:  Diagnosis Date   ADD (attention deficit disorder)    Arthritis    Depression    Hypertension    Pre-diabetes     PCP: Gordan Payment., MD   Discharged Condition: good  Hospital Course:  Patient underwent the above stated procedure on 07/13/2020. Patient tolerated the procedure well and brought to the recovery room in good condition and subsequently to the floor. Patient had an uncomplicated hospital course and was stable for discharge.   Disposition: Discharge disposition: 01-Home or Self Care      with follow up in 2 weeks    Follow-up Information    Beverely Low, MD. Call in 2 weeks.   Specialty: Orthopedic Surgery Why: [phone], call for appt Contact information: [address] [place] [address] [phone]               Dental Antibiotics:  In most cases prophylactic antibiotics for Dental procdeures after total joint surgery are not necessary.  Exceptions are as follows:  1. History of prior total joint infection  2.  Severely immunocompromised (Organ Transplant, cancer chemotherapy, Rheumatoid biologic meds such as Humera)  3. Poorly controlled diabetes (A1C &gt; 8.0, blood glucose over 200)  If you have one of these conditions, contact your surgeon for an antibiotic prescription, prior to your dental procedure.  Discharge Instructions    Call MD / Call 911   Complete by: As directed    If you experience chest pain or shortness of breath, CALL 911 and be transported to the hospital emergency room.  If you develope a fever above 101 F, pus (white drainage) or increased drainage or redness at the wound, or calf pain, call your surgeon's office.   Constipation Prevention   Complete by: As directed    Drink plenty of fluids.  Prune juice may be helpful.  You may use a stool softener, such as Colace (over the counter) 100 mg twice a day.  Use MiraLax (over the counter) for constipation as needed.   Diet - low sodium heart healthy   Complete by: As directed    Increase activity slowly as tolerated   Complete by: As directed       Allergies as of 07/14/2020      Reactions   Penicillins Swelling, Rash, Other (See Comments)   Mouth/tongue      Medication List    TAKE these medications   amphetamine-dextroamphetamine 15 MG 24 hr capsule Commonly known as: ADDERALL XR Take 15 mg by mouth daily as needed (focus/attention).   amphetamine-dextroamphetamine 5 MG 24 hr capsule Commonly known as: ADDERALL XR Take 5 mg by mouth  daily as needed (focus/attention).   aspirin EC 81 MG tablet Take 81 mg by mouth every other day. Swallow whole.   atorvastatin 10 MG tablet Commonly known as: LIPITOR Take 10 mg by mouth at bedtime.   bisoprolol-hydrochlorothiazide 5-6.25 MG tablet Commonly known as: ZIAC Take 1 tablet by mouth daily.   buPROPion 300 MG 24 hr tablet Commonly known as: WELLBUTRIN XL Take 300 mg by mouth daily.   cetirizine 10 MG tablet Commonly known as: ZYRTEC Take 10 mg by mouth  daily.   FLAX SEED OIL PO Take 1,400 mg by mouth at bedtime.   FLUoxetine 20 MG capsule Commonly known as: PROZAC Take 20 mg by mouth daily.   fluticasone 50 MCG/ACT nasal spray Commonly known as: FLONASE Place 1 spray into both nostrils daily.   gabapentin 100 MG capsule Commonly known as: NEURONTIN Take 200 mg by mouth at bedtime.   meloxicam 7.5 MG tablet Commonly known as: MOBIC Take 7.5 mg by mouth daily as needed for pain.   montelukast 10 MG tablet Commonly known as: SINGULAIR Take 10 mg by mouth daily.   multivitamin with minerals Tabs tablet Take 1 tablet by mouth at bedtime. Centrum Silver   NON FORMULARY Place 1 drop into both eyes in the morning, at noon, in the evening, and at bedtime. Autologous Serum Therapy   ondansetron 4 MG tablet Commonly known as: Zofran Take 1 tablet (4 mg total) by mouth every 8 (eight) hours as needed for nausea, vomiting or refractory nausea / vomiting.   oxyCODONE-acetaminophen 5-325 MG tablet Commonly known as: Percocet Take 1 tablet by mouth every 4 (four) hours as needed for severe pain.   Systane Complete 0.6 % Soln Generic drug: Propylene Glycol Place 1 drop into both eyes 4 (four) times daily as needed (severe dry eyes.).   tiZANidine 4 MG tablet Commonly known as: ZANAFLEX Take 4 mg by mouth 3 (three) times daily as needed (neck spasms).   Ubiquinol 100 MG Caps Take 100 mg by mouth daily.   zolpidem 10 MG tablet Commonly known as: AMBIEN Take 10 mg by mouth at bedtime.         Signed: [name] 07/14/2020, 7:07 AM  [facility] is now [employer] [address], [place], [place] [address] Phone: [phone] Facebook   Instagram   [employer]

## 2020-07-16 ENCOUNTER — Encounter (HOSPITAL_COMMUNITY): Payer: Self-pay | Admitting: Orthopedic Surgery

## 2020-08-20 ENCOUNTER — Ambulatory Visit (INDEPENDENT_AMBULATORY_CARE_PROVIDER_SITE_OTHER): Payer: Medicare PPO | Admitting: *Deleted

## 2020-08-20 DIAGNOSIS — J309 Allergic rhinitis, unspecified: Secondary | ICD-10-CM

## 2020-09-03 ENCOUNTER — Ambulatory Visit (INDEPENDENT_AMBULATORY_CARE_PROVIDER_SITE_OTHER): Payer: Medicare PPO

## 2020-09-03 DIAGNOSIS — J309 Allergic rhinitis, unspecified: Secondary | ICD-10-CM | POA: Diagnosis not present

## 2020-09-24 ENCOUNTER — Ambulatory Visit (INDEPENDENT_AMBULATORY_CARE_PROVIDER_SITE_OTHER): Payer: Medicare PPO | Admitting: *Deleted

## 2020-09-24 DIAGNOSIS — J309 Allergic rhinitis, unspecified: Secondary | ICD-10-CM

## 2020-10-05 DIAGNOSIS — Z85828 Personal history of other malignant neoplasm of skin: Secondary | ICD-10-CM | POA: Insufficient documentation

## 2020-10-11 DIAGNOSIS — Z961 Presence of intraocular lens: Secondary | ICD-10-CM | POA: Insufficient documentation

## 2020-10-25 ENCOUNTER — Ambulatory Visit (INDEPENDENT_AMBULATORY_CARE_PROVIDER_SITE_OTHER): Payer: Medicare PPO

## 2020-10-25 DIAGNOSIS — J309 Allergic rhinitis, unspecified: Secondary | ICD-10-CM

## 2020-11-19 ENCOUNTER — Ambulatory Visit (INDEPENDENT_AMBULATORY_CARE_PROVIDER_SITE_OTHER): Payer: Medicare PPO | Admitting: *Deleted

## 2020-11-19 DIAGNOSIS — J309 Allergic rhinitis, unspecified: Secondary | ICD-10-CM | POA: Diagnosis not present

## 2020-12-03 ENCOUNTER — Ambulatory Visit (INDEPENDENT_AMBULATORY_CARE_PROVIDER_SITE_OTHER): Payer: Medicare PPO | Admitting: *Deleted

## 2020-12-03 DIAGNOSIS — J309 Allergic rhinitis, unspecified: Secondary | ICD-10-CM

## 2020-12-17 ENCOUNTER — Ambulatory Visit (INDEPENDENT_AMBULATORY_CARE_PROVIDER_SITE_OTHER): Payer: Medicare PPO | Admitting: *Deleted

## 2020-12-17 DIAGNOSIS — J309 Allergic rhinitis, unspecified: Secondary | ICD-10-CM | POA: Diagnosis not present

## 2020-12-31 ENCOUNTER — Ambulatory Visit (INDEPENDENT_AMBULATORY_CARE_PROVIDER_SITE_OTHER): Payer: Medicare PPO | Admitting: *Deleted

## 2020-12-31 DIAGNOSIS — J309 Allergic rhinitis, unspecified: Secondary | ICD-10-CM

## 2021-01-09 DIAGNOSIS — J301 Allergic rhinitis due to pollen: Secondary | ICD-10-CM | POA: Diagnosis not present

## 2021-01-09 NOTE — Progress Notes (Signed)
VIALS EXP 01-09-22 

## 2021-01-10 DIAGNOSIS — J3089 Other allergic rhinitis: Secondary | ICD-10-CM | POA: Diagnosis not present

## 2021-01-14 ENCOUNTER — Ambulatory Visit (INDEPENDENT_AMBULATORY_CARE_PROVIDER_SITE_OTHER): Payer: Medicare PPO | Admitting: *Deleted

## 2021-01-14 DIAGNOSIS — J309 Allergic rhinitis, unspecified: Secondary | ICD-10-CM

## 2021-02-19 ENCOUNTER — Ambulatory Visit (INDEPENDENT_AMBULATORY_CARE_PROVIDER_SITE_OTHER): Payer: Medicare PPO

## 2021-02-19 DIAGNOSIS — J309 Allergic rhinitis, unspecified: Secondary | ICD-10-CM | POA: Diagnosis not present

## 2021-03-25 ENCOUNTER — Ambulatory Visit (INDEPENDENT_AMBULATORY_CARE_PROVIDER_SITE_OTHER): Payer: Medicare PPO | Admitting: *Deleted

## 2021-03-25 DIAGNOSIS — J309 Allergic rhinitis, unspecified: Secondary | ICD-10-CM | POA: Diagnosis not present

## 2021-04-08 ENCOUNTER — Ambulatory Visit (INDEPENDENT_AMBULATORY_CARE_PROVIDER_SITE_OTHER): Payer: Medicare PPO | Admitting: *Deleted

## 2021-04-08 DIAGNOSIS — J309 Allergic rhinitis, unspecified: Secondary | ICD-10-CM

## 2021-04-23 ENCOUNTER — Ambulatory Visit (INDEPENDENT_AMBULATORY_CARE_PROVIDER_SITE_OTHER): Payer: Medicare PPO | Admitting: *Deleted

## 2021-04-23 DIAGNOSIS — J309 Allergic rhinitis, unspecified: Secondary | ICD-10-CM | POA: Diagnosis not present

## 2021-05-13 ENCOUNTER — Ambulatory Visit (INDEPENDENT_AMBULATORY_CARE_PROVIDER_SITE_OTHER): Payer: Medicare PPO | Admitting: *Deleted

## 2021-05-13 DIAGNOSIS — J309 Allergic rhinitis, unspecified: Secondary | ICD-10-CM

## 2021-05-22 ENCOUNTER — Other Ambulatory Visit: Payer: Self-pay | Admitting: Physician Assistant

## 2021-05-22 DIAGNOSIS — M25571 Pain in right ankle and joints of right foot: Secondary | ICD-10-CM

## 2021-05-23 ENCOUNTER — Ambulatory Visit
Admission: RE | Admit: 2021-05-23 | Discharge: 2021-05-23 | Disposition: A | Discharge status: Home / Self Care | Payer: Medicare PPO | Source: Ambulatory Visit | Attending: Physician Assistant | Admitting: Physician Assistant

## 2021-05-23 DIAGNOSIS — M25571 Pain in right ankle and joints of right foot: Secondary | ICD-10-CM

## 2021-05-27 DIAGNOSIS — M67979 Unspecified disorder of synovium and tendon, unspecified ankle and foot: Secondary | ICD-10-CM | POA: Insufficient documentation

## 2021-05-28 ENCOUNTER — Ambulatory Visit: Payer: Medicare PPO | Admitting: Sports Medicine

## 2021-05-31 ENCOUNTER — Other Ambulatory Visit (HOSPITAL_COMMUNITY): Payer: Self-pay | Admitting: Orthopedic Surgery

## 2021-06-06 ENCOUNTER — Encounter (HOSPITAL_BASED_OUTPATIENT_CLINIC_OR_DEPARTMENT_OTHER): Payer: Self-pay | Admitting: Orthopedic Surgery

## 2021-06-06 ENCOUNTER — Other Ambulatory Visit: Payer: Self-pay

## 2021-06-07 ENCOUNTER — Encounter (HOSPITAL_BASED_OUTPATIENT_CLINIC_OR_DEPARTMENT_OTHER)
Admission: RE | Admit: 2021-06-07 | Discharge: 2021-06-07 | Disposition: A | Discharge status: Home / Self Care | Payer: Medicare PPO | Source: Ambulatory Visit | Attending: Orthopedic Surgery | Admitting: Orthopedic Surgery

## 2021-06-07 ENCOUNTER — Other Ambulatory Visit: Payer: Self-pay

## 2021-06-07 DIAGNOSIS — Z01812 Encounter for preprocedural laboratory examination: Secondary | ICD-10-CM | POA: Diagnosis present

## 2021-06-07 LAB — BASIC METABOLIC PANEL
Anion gap: 6 (ref 5–15)
BUN: 11 mg/dL (ref 8–23)
CO2: 29 mmol/L (ref 22–32)
Calcium: 9.5 mg/dL (ref 8.9–10.3)
Chloride: 101 mmol/L (ref 98–111)
Creatinine, Ser: 0.9 mg/dL (ref 0.44–1.00)
GFR, Estimated: >60 mL/min (ref >60)
Glucose, Bld: 115 mg/dL — ABNORMAL HIGH (ref 70–99)
Potassium: 4.6 mmol/L (ref 3.5–5.1)
Sodium: 136 mmol/L (ref 135–145)

## 2021-06-07 NOTE — Progress Notes (Signed)

## 2021-06-10 ENCOUNTER — Ambulatory Visit (INDEPENDENT_AMBULATORY_CARE_PROVIDER_SITE_OTHER): Payer: Medicare PPO | Admitting: *Deleted

## 2021-06-10 DIAGNOSIS — J309 Allergic rhinitis, unspecified: Secondary | ICD-10-CM | POA: Diagnosis not present

## 2021-06-13 ENCOUNTER — Ambulatory Visit (HOSPITAL_BASED_OUTPATIENT_CLINIC_OR_DEPARTMENT_OTHER): Payer: Medicare PPO | Admitting: Certified Registered"

## 2021-06-13 ENCOUNTER — Ambulatory Visit (HOSPITAL_BASED_OUTPATIENT_CLINIC_OR_DEPARTMENT_OTHER)
Admission: RE | Admit: 2021-06-13 | Discharge: 2021-06-13 | Disposition: A | Discharge status: Home / Self Care | Payer: Medicare PPO | Attending: Orthopedic Surgery | Admitting: Orthopedic Surgery

## 2021-06-13 ENCOUNTER — Encounter (HOSPITAL_BASED_OUTPATIENT_CLINIC_OR_DEPARTMENT_OTHER): Payer: Self-pay | Admitting: Orthopedic Surgery

## 2021-06-13 ENCOUNTER — Encounter (HOSPITAL_BASED_OUTPATIENT_CLINIC_OR_DEPARTMENT_OTHER)
Admission: RE | Disposition: A | Discharge status: Home / Self Care | Payer: Self-pay | Source: Home / Self Care | Attending: Orthopedic Surgery

## 2021-06-13 ENCOUNTER — Other Ambulatory Visit: Payer: Self-pay

## 2021-06-13 DIAGNOSIS — M6701 Short Achilles tendon (acquired), right ankle: Secondary | ICD-10-CM | POA: Diagnosis not present

## 2021-06-13 DIAGNOSIS — Z7982 Long term (current) use of aspirin: Secondary | ICD-10-CM | POA: Diagnosis not present

## 2021-06-13 DIAGNOSIS — R7303 Prediabetes: Secondary | ICD-10-CM | POA: Insufficient documentation

## 2021-06-13 DIAGNOSIS — Z880 Allergy status to penicillin: Secondary | ICD-10-CM | POA: Insufficient documentation

## 2021-06-13 DIAGNOSIS — Z791 Long term (current) use of non-steroidal anti-inflammatories (NSAID): Secondary | ICD-10-CM | POA: Insufficient documentation

## 2021-06-13 DIAGNOSIS — M66871 Spontaneous rupture of other tendons, right ankle and foot: Secondary | ICD-10-CM | POA: Diagnosis not present

## 2021-06-13 DIAGNOSIS — E785 Hyperlipidemia, unspecified: Secondary | ICD-10-CM | POA: Insufficient documentation

## 2021-06-13 DIAGNOSIS — I10 Essential (primary) hypertension: Secondary | ICD-10-CM | POA: Diagnosis not present

## 2021-06-13 DIAGNOSIS — Z79899 Other long term (current) drug therapy: Secondary | ICD-10-CM | POA: Diagnosis not present

## 2021-06-13 HISTORY — PX: ACHILLES TENDON LENGTHENING: SHX6455

## 2021-06-13 HISTORY — DX: Hyperlipidemia, unspecified: E78.5

## 2021-06-13 HISTORY — PX: TENDON REPAIR: SHX5111

## 2021-06-13 SURGERY — LENGTHENING, TENDON, ACHILLES
Anesthesia: General | Site: Foot | Laterality: Right

## 2021-06-13 MED ORDER — LIDOCAINE HCL (CARDIAC) PF 100 MG/5ML IV SOSY
PREFILLED_SYRINGE | INTRAVENOUS | Status: DC | PRN
  Administered 2021-06-13: 30 mg via INTRAVENOUS

## 2021-06-13 MED ORDER — DEXAMETHASONE SODIUM PHOSPHATE 10 MG/ML IJ SOLN
INTRAMUSCULAR | Status: DC | PRN
  Administered 2021-06-13: 4 mg via INTRAVENOUS

## 2021-06-13 MED ORDER — PROPOFOL 10 MG/ML IV BOLUS
INTRAVENOUS | Status: DC | PRN
  Administered 2021-06-13: 200 mg via INTRAVENOUS
  Administered 2021-06-13: 30 mg via INTRAVENOUS

## 2021-06-13 MED ORDER — LACTATED RINGERS IV SOLN: INTRAVENOUS | Status: DC

## 2021-06-13 MED ORDER — EPHEDRINE SULFATE 50 MG/ML IJ SOLN
INTRAMUSCULAR | Status: DC | PRN
  Administered 2021-06-13 (×2): 10 mg via INTRAVENOUS
  Administered 2021-06-13: 5 mg via INTRAVENOUS
  Administered 2021-06-13: 10 mg via INTRAVENOUS

## 2021-06-13 MED ORDER — VANCOMYCIN HCL 500 MG IV SOLR
INTRAVENOUS | Status: DC | PRN
  Administered 2021-06-13: 500 mg via TOPICAL

## 2021-06-13 MED ORDER — FENTANYL CITRATE (PF) 100 MCG/2ML IJ SOLN
INTRAMUSCULAR | Status: AC
  Filled 2021-06-13: qty 2

## 2021-06-13 MED ORDER — OXYCODONE HCL 5 MG PO TABS
ORAL_TABLET | ORAL | Status: AC
  Filled 2021-06-13: qty 1

## 2021-06-13 MED ORDER — OXYCODONE HCL 5 MG/5ML PO SOLN: 5.0000 mg | Freq: Once | ORAL | Status: AC | PRN

## 2021-06-13 MED ORDER — ONDANSETRON HCL 4 MG/2ML IJ SOLN: 4.0000 mg | Freq: Four times a day (QID) | INTRAMUSCULAR | Status: DC | PRN

## 2021-06-13 MED ORDER — ONDANSETRON HCL 4 MG/2ML IJ SOLN
INTRAMUSCULAR | Status: DC | PRN
  Administered 2021-06-13: 4 mg via INTRAVENOUS

## 2021-06-13 MED ORDER — CEFAZOLIN SODIUM-DEXTROSE 2-4 GM/100ML-% IV SOLN
2.0000 g | INTRAVENOUS | Status: AC
  Administered 2021-06-13: 2 g via INTRAVENOUS

## 2021-06-13 MED ORDER — BUPIVACAINE-EPINEPHRINE (PF) 0.5% -1:200000 IJ SOLN
INTRAMUSCULAR | Status: DC | PRN
  Administered 2021-06-13: 25 mL via PERINEURAL

## 2021-06-13 MED ORDER — OXYCODONE HCL 5 MG PO TABS: 5.0000 mg | ORAL_TABLET | Freq: Four times a day (QID) | ORAL | 0 refills | Status: AC | PRN

## 2021-06-13 MED ORDER — SODIUM CHLORIDE 0.9 % IV SOLN: INTRAVENOUS | Status: DC

## 2021-06-13 MED ORDER — MIDAZOLAM HCL 2 MG/2ML IJ SOLN
INTRAMUSCULAR | Status: AC
  Filled 2021-06-13: qty 2

## 2021-06-13 MED ORDER — PROPOFOL 500 MG/50ML IV EMUL
INTRAVENOUS | Status: DC | PRN
  Administered 2021-06-13: 25 ug/kg/min via INTRAVENOUS

## 2021-06-13 MED ORDER — FENTANYL CITRATE (PF) 100 MCG/2ML IJ SOLN
25.0000 ug | INTRAMUSCULAR | Status: DC | PRN
  Administered 2021-06-13: 50 ug via INTRAVENOUS

## 2021-06-13 MED ORDER — FENTANYL CITRATE (PF) 100 MCG/2ML IJ SOLN
100.0000 ug | Freq: Once | INTRAMUSCULAR | Status: AC
  Administered 2021-06-13: 50 ug via INTRAVENOUS

## 2021-06-13 MED ORDER — MIDAZOLAM HCL 2 MG/2ML IJ SOLN
2.0000 mg | Freq: Once | INTRAMUSCULAR | Status: AC
  Administered 2021-06-13: 1 mg via INTRAVENOUS

## 2021-06-13 MED ORDER — OXYCODONE HCL 5 MG PO TABS
5.0000 mg | ORAL_TABLET | Freq: Once | ORAL | Status: AC | PRN
  Administered 2021-06-13: 5 mg via ORAL

## 2021-06-13 MED ORDER — PHENYLEPHRINE HCL (PRESSORS) 10 MG/ML IV SOLN
INTRAVENOUS | Status: DC | PRN
  Administered 2021-06-13: 40 ug via INTRAVENOUS

## 2021-06-13 MED ORDER — CEFAZOLIN SODIUM-DEXTROSE 2-4 GM/100ML-% IV SOLN
INTRAVENOUS | Status: AC
  Filled 2021-06-13: qty 100

## 2021-06-13 MED ORDER — 0.9 % SODIUM CHLORIDE (POUR BTL) OPTIME
TOPICAL | Status: DC | PRN
  Administered 2021-06-13: 500 mL

## 2021-06-13 SURGICAL SUPPLY — 80 items
APL PRP STRL LF DISP 70% ISPRP (MISCELLANEOUS) ×1
BANDAGE ESMARK 6X9 LF (GAUZE/BANDAGES/DRESSINGS) ×1 IMPLANT
BLADE AVERAGE 25X9 (BLADE) IMPLANT
BLADE SURG 15 STRL LF DISP TIS (BLADE) ×2 IMPLANT
BLADE SURG 15 STRL SS (BLADE) ×4
BNDG CMPR 9X4 STRL LF SNTH (GAUZE/BANDAGES/DRESSINGS)
BNDG CMPR 9X6 STRL LF SNTH (GAUZE/BANDAGES/DRESSINGS) ×1
BNDG COHESIVE 4X5 TAN ST LF (GAUZE/BANDAGES/DRESSINGS) IMPLANT
BNDG COHESIVE 6X5 TAN ST LF (GAUZE/BANDAGES/DRESSINGS) IMPLANT
BNDG ESMARK 4X9 LF (GAUZE/BANDAGES/DRESSINGS) IMPLANT
BNDG ESMARK 6X9 LF (GAUZE/BANDAGES/DRESSINGS) ×2
CHLORAPREP W/TINT 26 (MISCELLANEOUS) ×2 IMPLANT
COVER BACK TABLE 60X90IN (DRAPES) ×2 IMPLANT
CUFF TOURN SGL QUICK 34 (TOURNIQUET CUFF)
CUFF TRNQT CYL 34X4.125X (TOURNIQUET CUFF) ×1 IMPLANT
DECANTER SPIKE VIAL GLASS SM (MISCELLANEOUS) IMPLANT
DRAPE C-ARM 42X72 X-RAY (DRAPES) IMPLANT
DRAPE EXTREMITY T 121X128X90 (DISPOSABLE) ×2 IMPLANT
DRAPE OEC MINIVIEW 54X84 (DRAPES) ×1 IMPLANT
DRAPE U-SHAPE 47X51 STRL (DRAPES) ×1 IMPLANT
DRSG MEPITEL 4X7.2 (GAUZE/BANDAGES/DRESSINGS) IMPLANT
DRSG PAD ABDOMINAL 8X10 ST (GAUZE/BANDAGES/DRESSINGS) ×4 IMPLANT
ELECT REM PT RETURN 9FT ADLT (ELECTROSURGICAL) ×2
ELECTRODE REM PT RTRN 9FT ADLT (ELECTROSURGICAL) ×1 IMPLANT
GAUZE SPONGE 4X4 12PLY STRL (GAUZE/BANDAGES/DRESSINGS) ×2 IMPLANT
GLOVE SRG 8 PF TXTR STRL LF DI (GLOVE) ×2 IMPLANT
GLOVE SURG ENC MOIS LTX SZ8 (GLOVE) ×2 IMPLANT
GLOVE SURG UNDER POLY LF SZ8 (GLOVE) ×2
GOWN STRL REUS W/ TWL LRG LVL3 (GOWN DISPOSABLE) ×1 IMPLANT
GOWN STRL REUS W/ TWL XL LVL3 (GOWN DISPOSABLE) ×2 IMPLANT
GOWN STRL REUS W/TWL LRG LVL3 (GOWN DISPOSABLE) ×4
GOWN STRL REUS W/TWL XL LVL3 (GOWN DISPOSABLE) ×4
GRAFT TISS 230-320 GRACILIS (Bone Implant) IMPLANT
K-WIRE .062X4 (WIRE) IMPLANT
KIT ACCESSORY DRILL 5 (KITS) ×1 IMPLANT
NDL SUT 6 .5 CRC .975X.05 MAYO (NEEDLE) IMPLANT
NEEDLE HYPO 22GX1.5 SAFETY (NEEDLE) IMPLANT
NEEDLE MAYO TAPER (NEEDLE)
NS IRRIG 1000ML POUR BTL (IV SOLUTION) ×3 IMPLANT
PACK BASIN DAY SURGERY FS (CUSTOM PROCEDURE TRAY) ×2 IMPLANT
PAD CAST 4YDX4 CTTN HI CHSV (CAST SUPPLIES) ×1 IMPLANT
PADDING CAST ABS 4INX4YD NS (CAST SUPPLIES)
PADDING CAST ABS COTTON 4X4 ST (CAST SUPPLIES) IMPLANT
PADDING CAST COTTON 4X4 STRL (CAST SUPPLIES) ×2
PADDING CAST COTTON 6X4 STRL (CAST SUPPLIES) ×2 IMPLANT
PASSER SUT SWANSON 36MM LOOP (INSTRUMENTS) IMPLANT
PENCIL SMOKE EVACUATOR (MISCELLANEOUS) ×2 IMPLANT
RETRIEVER SUT HEWSON (MISCELLANEOUS) IMPLANT
SANITIZER HAND PURELL 535ML FO (MISCELLANEOUS) ×2 IMPLANT
SCOTCHCAST PLUS 4X4 WHITE (CAST SUPPLIES) ×3 IMPLANT
SCREW PEEK TENODESIS 6X12MM (Screw) ×1 IMPLANT
SHEET MEDIUM DRAPE 40X70 STRL (DRAPES) ×2 IMPLANT
SLEEVE SCD COMPRESS KNEE MED (STOCKING) ×2 IMPLANT
SPLINT FAST PLASTER 5X30 (CAST SUPPLIES)
SPLINT PLASTER CAST FAST 5X30 (CAST SUPPLIES) IMPLANT
SPONGE T-LAP 18X18 ~~LOC~~+RFID (SPONGE) ×2 IMPLANT
STOCKINETTE 6  STRL (DRAPES) ×2
STOCKINETTE 6 STRL (DRAPES) ×1 IMPLANT
SUCTION FRAZIER HANDLE 10FR (MISCELLANEOUS) ×2
SUCTION TUBE FRAZIER 10FR DISP (MISCELLANEOUS) IMPLANT
SUT ETHIBOND 0 MO6 C/R (SUTURE) IMPLANT
SUT ETHIBOND 2 OS 4 DA (SUTURE) IMPLANT
SUT ETHILON 3 0 PS 1 (SUTURE) ×2 IMPLANT
SUT FIBERWIRE 2-0 18 17.9 3/8 (SUTURE)
SUT MERSILENE 2.0 SH NDLE (SUTURE) IMPLANT
SUT MNCRL AB 3-0 PS2 18 (SUTURE) ×2 IMPLANT
SUT VIC AB 1 CT1 27 (SUTURE) ×4
SUT VIC AB 1 CT1 27XBRD ANBCTR (SUTURE) IMPLANT
SUT VIC AB 2-0 SH 27 (SUTURE) ×2
SUT VIC AB 2-0 SH 27XBRD (SUTURE) IMPLANT
SUT VICRYL 0 SH 27 (SUTURE) ×3 IMPLANT
SUT VICRYL 0 UR6 27IN ABS (SUTURE) IMPLANT
SUTURE FIBERWR 2-0 18 17.9 3/8 (SUTURE) IMPLANT
SYR BULB EAR ULCER 3OZ GRN STR (SYRINGE) ×2 IMPLANT
SYR CONTROL 10ML LL (SYRINGE) IMPLANT
TENDON GRACILIS FROZEN (Bone Implant) ×2 IMPLANT
TENDON GRACILIS FROZEN 230-320 (Bone Implant) ×1 IMPLANT
TOWEL GREEN STERILE FF (TOWEL DISPOSABLE) ×2 IMPLANT
TUBE CONNECTING 20X1/4 (TUBING) ×1 IMPLANT
UNDERPAD 30X36 HEAVY ABSORB (UNDERPADS AND DIAPERS) ×2 IMPLANT

## 2021-06-13 NOTE — Discharge Instructions (Addendum)
Tricia Hewitt, MD [facility]  Please read the following information regarding your care after surgery.  Medications  You only need a prescription for the narcotic pain medicine (ex. oxycodone, Percocet, Norco).  All of the other medicines listed below are available over the counter. X Aleve 2 pills twice a day for the first 3 days after surgery. X acetominophen (Tylenol) 650 mg every 4-6 hours as you need for minor to moderate pain X oxycodone as prescribed for severe pain  Narcotic pain medicine (ex. oxycodone, Percocet, Vicodin) will cause constipation.  To prevent this problem, take the following medicines while you are taking any pain medicine. X docusate sodium (Colace) 100 mg twice a day X senna (Senokot) 2 tablets twice a day  X To help prevent blood clots, take a baby aspirin (81 mg) twice a day for two weeks after surgery.  You should also get up every hour while you are awake to move around.    Weight Bearing X Do not bear any weight on the operated leg or foot.  Cast / Splint / Dressing X Keep your splint, cast or dressing clean and dry.  Don't put anything (coat hanger, pencil, etc) down inside of it.  If it gets damp, use a hair dryer on the cool setting to dry it.  If it gets soaked, call the office to schedule an appointment for a cast change.   After your dressing, cast or splint is removed; you may shower, but do not soak or scrub the wound.  Allow the water to run over it, and then gently pat it dry.  Swelling It is normal for you to have swelling where you had surgery.  To reduce swelling and pain, keep your toes above your nose for at least 3 days after surgery.  It may be necessary to keep your foot or leg elevated for several weeks.  If it hurts, it should be elevated.  Follow Up Call my office at [phone] when you are discharged from the hospital or surgery center to schedule an appointment to be seen two weeks after surgery.  Call my office at [phone] if  you develop a fever >101.5 F, nausea, vomiting, bleeding from the surgical site or severe pain.     Post Anesthesia Home Care Instructions  Activity: Get plenty of rest for the remainder of the day. A responsible individual must stay with you for 24 hours following the procedure.  For the next 24 hours, DO NOT: -Drive a car -Operate machinery -Drink alcoholic beverages -Take any medication unless instructed by your physician -Make any legal decisions or sign important papers.  Meals: Start with liquid foods such as gelatin or soup. Progress to regular foods as tolerated. Avoid greasy, spicy, heavy foods. If nausea and/or vomiting occur, drink only clear liquids until the nausea and/or vomiting subsides. Call your physician if vomiting continues.  Special Instructions/Symptoms: Your throat may feel dry or sore from the anesthesia or the breathing tube placed in your throat during surgery. If this causes discomfort, gargle with warm salt water. The discomfort should disappear within 24 hours.  If you had a scopolamine patch placed behind your ear for the management of post- operative nausea and/or vomiting:  1. The medication in the patch is effective for 72 hours, after which it should be removed.  Wrap patch in a tissue and discard in the trash. Wash hands thoroughly with soap and water. 2. You may remove the patch earlier than 72 hours if you experience   unpleasant side effects which may include dry mouth, dizziness or visual disturbances. 3. Avoid touching the patch. Wash your hands with soap and water after contact with the patch.    Regional Anesthesia Blocks  1. Numbness or the inability to move the "blocked" extremity may last from 3-48 hours after placement. The length of time depends on the medication injected and your individual response to the medication. If the numbness is not going away after 48 hours, call your surgeon.  2. The extremity that is blocked will need to be  protected until the numbness is gone and the  Strength has returned. Because you cannot feel it, you will need to take extra care to avoid injury. Because it may be weak, you may have difficulty moving it or using it. You may not know what position it is in without looking at it while the block is in effect.  3. For blocks in the legs and feet, returning to weight bearing and walking needs to be done carefully. You will need to wait until the numbness is entirely gone and the strength has returned. You should be able to move your leg and foot normally before you try and bear weight or walk. You will need someone to be with you when you first try to ensure you do not fall and possibly risk injury.  4. Bruising and tenderness at the needle site are common side effects and will resolve in a few days.  5. Persistent numbness or new problems with movement should be communicated to the surgeon or the [facility] ([phone])/  Surgery Center ([phone]). 

## 2021-06-13 NOTE — Progress Notes (Signed)
Assisted Dr. Hodierne with right, ultrasound guided, popliteal block. Side rails up, monitors on throughout procedure. See vital signs in flow sheet. Tolerated Procedure well. 

## 2021-06-13 NOTE — Op Note (Signed)
06/13/2021  2:14 PM  PATIENT:  Tricia Hayes  75 y.o. female  PRE-OPERATIVE DIAGNOSIS: 1.  Right ankle tibialis anterior tendon rupture 2.  Short right Achilles tendon  POST-OPERATIVE DIAGNOSIS: Same   Procedure(s): 1.  Right percutaneous Achilles tendon lengthening 2.  Right tibialis anterior tendon reconstruction with gracilis allograft 3.  AP and lateral radiographs of the right foot  SURGEON:  Toni Arthurs, MD  ASSISTANT: none  ANESTHESIA:   General, regional  EBL:  minimal   TOURNIQUET:   Total Tourniquet Time Documented: Thigh (Right) - 50 minutes Total: Thigh (Right) - 50 minutes  COMPLICATIONS:  None apparent  DISPOSITION:  Extubated, awake and stable to recovery.  INDICATION FOR PROCEDURE: The patient is a 75 year old female who sustained a spontaneous rupture of her tibialis anterior tendon over a month ago.  She has a tight heel cord as well.  She presents now for operative treatment of this acute tendon injury.  The risks and benefits of the alternative treatment options have been discussed in detail.  The patient wishes to proceed with surgery and specifically understands risks of bleeding, infection, nerve damage, blood clots, need for additional surgery, amputation and death.   PROCEDURE IN DETAIL:  After pre operative consent was obtained, and the correct operative site was identified, the patient was brought to the operating room and placed supine on the OR table.  Anesthesia was administered.  Pre-operative antibiotics were administered.  A surgical timeout was taken.  The right lower extremity was prepped and draped in standard sterile fashion with a tourniquet around the thigh.  The extremity was elevated and the tourniquet was inflated to 250 mmHg.  A percutaneous triple hemisection Achilles tendon lengthening was performed with a #15 blade.  The ankle would then dorsiflex passively to 30 degrees.  An incision was then made along the tibialis anterior tendon  from the extensor retinaculum down to the insertion at the medial cuneiform.  Dissection was carried sharply down through the subcutaneous tissues.  The extensor retinaculum was incised.  The stump of the tibialis anterior tendon was identified proximally at the distal tibial extensor retinaculum.  The stump was cleared of all scar tissue.  It could not be passively reduced to its insertion point.  The decision was made to proceed with allograft reconstruction.  AP and lateral radiographs were used to locate a drill hole in the medial cuneiform.  A gracilis allograft was placed through the drill hole in the medial cuneiform and doubled on itself.  It was twisted and then sewn together with 0 Vicryl sutures.  The proximal end of the tendon was then passed through the tibialis anterior stump using a Pulvertaft weave.  It was then doubled back over the allograft tendon and sewn in place with figure-of-eight sutures of 0 Vicryl.  The tendon was further secured to the medial cuneiform with a 6 mm [place] medical bolt.  The reconstructed tendon was oversewn with a running locking 3-0 Monocryl suture.  The wound was irrigated and sprinkled with vancomycin powder.  The tendon sheath was repaired with inverted simple sutures of 0 Vicryl.  Subcutaneous tissues were approximated with 2-0 Vicryl.  Skin incision was closed with running 3-0 nylon.  Sterile dressings were applied followed by a well-padded short leg cast with the ankle in neutral dorsiflexion.  The tourniquet was released after application of the dressings.  The patient was awakened from anesthesia and transported to the recovery room in stable condition.  FOLLOW UP PLAN: Nonweightbearing  on the right lower extremity in a short leg cast.  Follow-up in the office in 2 weeks for suture removal and conversion to a weightbearing short leg cast for a month.  Aspirin for DVT prophylaxis.   RADIOGRAPHS: AP and lateral radiographs of the right foot show appropriate  position of the guidepin in the medial cuneiform.  No acute injuries are noted.

## 2021-06-13 NOTE — Anesthesia Procedure Notes (Signed)
Procedure Name: LMA Insertion Date/Time: 06/13/2021 12:54 PM Performed by: Sheryn Bison, CRNA Pre-anesthesia Checklist: Patient identified, Emergency Drugs available, Suction available and Patient being monitored Patient Re-evaluated:Patient Re-evaluated prior to induction Oxygen Delivery Method: Circle System Utilized Preoxygenation: Pre-oxygenation with 100% oxygen Induction Type: IV induction Ventilation: Mask ventilation without difficulty LMA: LMA inserted LMA Size: 4.0 Number of attempts: 1 Airway Equipment and Method: bite block Placement Confirmation: positive ETCO2 Tube secured with: Tape Dental Injury: Teeth and Oropharynx as per pre-operative assessment

## 2021-06-13 NOTE — Anesthesia Procedure Notes (Signed)
Anesthesia Regional Block: Popliteal block   Pre-Anesthetic Checklist: , timeout performed,  Correct Patient, Correct Site, Correct Laterality,  Correct Procedure, Correct Position, site marked,  Risks and benefits discussed,  Surgical consent,  Pre-op evaluation,  At surgeon's request and post-op pain management  Laterality: Right  Prep: chloraprep       Needles:  Injection technique: Single-shot  Needle Type: Echogenic Stimulator Needle          Additional Needles:   Procedures:, nerve stimulator,,,,,     Nerve Stimulator or Paresthesia:  Response: plantar flexion of foot, 0.45 mA  Additional Responses:   Narrative:  Start time: 06/13/2021 12:00 PM End time: 06/13/2021 12:08 PM Injection made incrementally with aspirations every 5 mL.  Performed by: Personally  Anesthesiologist: Achille Rich, MD  Additional Notes: Functioning IV was confirmed and monitors were applied.  A 56mm 21ga Arrow echogenic stimulator needle was used. Sterile prep and drape,hand hygiene and sterile gloves were used.  Negative aspiration and negative test dose prior to incremental administration of local anesthetic. The patient tolerated the procedure well.  Ultrasound guidance: relevent anatomy identified, needle position confirmed, local anesthetic spread visualized around nerve(s), vascular puncture avoided.  Image printed for medical record.

## 2021-06-13 NOTE — H&P (Signed)
Tricia Hayes is an 75 y.o. female.   Chief Complaint: Right tibialis anterior tendon rupture HPI: 75 year old female with a past medical history significant for diabetes complains of right ankle pain and swelling for the last month or so.  She denies any specific injury but has noticed a foot drop as well.  An MRI reveals a tibialis anterior tendon rupture.  She presents now for surgical treatment of this painful and limiting condition.  Past Medical History:  Diagnosis Date   ADD (attention deficit disorder)    Arthritis    Depression    Hyperlipidemia    Hypertension    Pre-diabetes     Past Surgical History:  Procedure Laterality Date   bilateral joinit replacement on both thumbs      REVERSE SHOULDER ARTHROPLASTY Left 07/13/2020   Procedure: REVERSE SHOULDER ARTHROPLASTY;  Surgeon: Beverely Low, MD;  Location: [facility];  Service: Orthopedics;  Laterality: Left;   right knee arthroscopy      TONSILLECTOMY      History reviewed. No pertinent family history. Social History:  reports that she has never smoked. She has never used smokeless tobacco. She reports current alcohol use. No history on file for drug use.  Allergies:  Allergies  Allergen Reactions   Penicillins Swelling, Rash and Other (See Comments)    Mouth/tongue    Medications Prior to Admission  Medication Sig Dispense Refill   amphetamine-dextroamphetamine (ADDERALL XR) 15 MG 24 hr capsule Take 15 mg by mouth daily as needed (focus/attention).      amphetamine-dextroamphetamine (ADDERALL XR) 5 MG 24 hr capsule Take 5 mg by mouth daily as needed (focus/attention).      aspirin EC 81 MG tablet Take 81 mg by mouth every other day. Swallow whole.      atorvastatin (LIPITOR) 10 MG tablet Take 10 mg by mouth at bedtime.      bisoprolol-hydrochlorothiazide (ZIAC) 5-6.25 MG tablet Take 1 tablet by mouth daily.     buPROPion (WELLBUTRIN XL) 300 MG 24 hr tablet Take 300 mg by mouth daily.     cetirizine (ZYRTEC) 10 MG  tablet Take 10 mg by mouth daily.     Flaxseed, Linseed, (FLAX SEED OIL PO) Take 1,400 mg by mouth at bedtime.     FLUoxetine (PROZAC) 20 MG capsule Take 20 mg by mouth daily.     fluticasone (FLONASE) 50 MCG/ACT nasal spray Place 1 spray into both nostrils daily.     gabapentin (NEURONTIN) 100 MG capsule Take 200 mg by mouth at bedtime.     meloxicam (MOBIC) 7.5 MG tablet Take 7.5 mg by mouth daily as needed for pain.     montelukast (SINGULAIR) 10 MG tablet Take 10 mg by mouth daily.     Multiple Vitamin (MULTIVITAMIN WITH MINERALS) TABS tablet Take 1 tablet by mouth at bedtime. Centrum Silver     NON FORMULARY Place 1 drop into both eyes in the morning, at noon, in the evening, and at bedtime. Autologous Serum Therapy     tiZANidine (ZANAFLEX) 4 MG tablet Take 4 mg by mouth 3 (three) times daily as needed (neck spasms).      Ubiquinol 100 MG CAPS Take 100 mg by mouth daily.     zolpidem (AMBIEN) 10 MG tablet Take 10 mg by mouth at bedtime.      ondansetron (ZOFRAN) 4 MG tablet Take 1 tablet (4 mg total) by mouth every 8 (eight) hours as needed for nausea, vomiting or refractory nausea / vomiting. 30  tablet 1   oxyCODONE-acetaminophen (PERCOCET) 5-325 MG tablet Take 1 tablet by mouth every 4 (four) hours as needed for severe pain. 20 tablet 0   Propylene Glycol (SYSTANE COMPLETE) 0.6 % SOLN Place 1 drop into both eyes 4 (four) times daily as needed (severe dry eyes.).      No results found for this or any previous visit (from the past 48 hour(s)). No results found.  Review of Systems no recent fever, chills, nausea, vomiting or changes in her appetite  Blood pressure 126/72, pulse 66, temperature (!) 97.3 F (36.3 C), temperature source Oral, resp. rate 15, height 5' 4.5" (1.638 m), weight 88 kg, SpO2 99 %. Physical Exam  Well-nourished well-developed woman in no apparent distress.  Alert and oriented.  Normal mood and affect.  The right ankle has healthy and intact skin.  Tibialis  anterior tendon has a palpable defect with some swelling at the level of the anterior ankle retinaculum.  5 out of 5 strength in plantarflexion.  Heel cord is tight.   Assessment/Plan Right tibialis anterior tendon rupture and tight heel cord -to the operating room today for percutaneous heel cord lengthening and tibialis anterior tendon reconstruction.  The risks and benefits of the alternative treatment options have been discussed in detail.  The patient wishes to proceed with surgery and specifically understands risks of bleeding, infection, nerve damage, blood clots, need for additional surgery, amputation and death.   Toni Arthurs, MD Jun 30, 2021, 12:22 PM

## 2021-06-13 NOTE — Anesthesia Preprocedure Evaluation (Signed)
Anesthesia Evaluation  Patient identified by MRN, date of birth, ID band Patient awake    Reviewed: Allergy & Precautions, H&P , NPO status , Patient's Chart, lab work & pertinent test results  Airway Mallampati: II   Neck ROM: full    Dental   Pulmonary neg pulmonary ROS,    breath sounds clear to auscultation       Cardiovascular hypertension,  Rhythm:regular Rate:Normal     Neuro/Psych PSYCHIATRIC DISORDERS Depression    GI/Hepatic   Endo/Other  obese  Renal/GU      Musculoskeletal  (+) Arthritis ,   Abdominal   Peds  Hematology   Anesthesia Other Findings   Reproductive/Obstetrics                             Anesthesia Physical Anesthesia Plan  ASA: 2  Anesthesia Plan: General   Post-op Pain Management:  Regional for Post-op pain   Induction: Intravenous  PONV Risk Score and Plan: 3 and Ondansetron, Dexamethasone, Midazolam and Treatment may vary due to age or medical condition  Airway Management Planned: LMA  Additional Equipment:   Intra-op Plan:   Post-operative Plan: Extubation in OR  Informed Consent: I have reviewed the patients History and Physical, chart, labs and discussed the procedure including the risks, benefits and alternatives for the proposed anesthesia with the patient or authorized representative who has indicated his/her understanding and acceptance.     Dental advisory given  Plan Discussed with: CRNA, Anesthesiologist and Surgeon  Anesthesia Plan Comments:         Anesthesia Quick Evaluation

## 2021-06-13 NOTE — Transfer of Care (Signed)
Immediate Anesthesia Transfer of Care Note  Patient: Tricia Hayes  Procedure(s) Performed: Right Achilles tendon lengthening; tibialis anterior tendon reconstruction (Right: Ankle)  Patient Location: PACU  Anesthesia Type:GA combined with regional for post-op pain  Level of Consciousness: awake, alert  and oriented  Airway & Oxygen Therapy: Patient Spontanous Breathing and Patient connected to face mask oxygen  Post-op Assessment: Report given to RN and Post -op Vital signs reviewed and stable  Post vital signs: Reviewed and stable  Last Vitals:  Vitals Value Taken Time  BP 134/71 06/13/21 1407  Temp    Pulse 81 06/13/21 1408  Resp 14 06/13/21 1408  SpO2 100 % 06/13/21 1408    Last Pain:  Vitals:   06/13/21 1027  TempSrc: Oral  PainSc: 0-No pain         Complications: No notable events documented.

## 2021-06-14 ENCOUNTER — Encounter (HOSPITAL_BASED_OUTPATIENT_CLINIC_OR_DEPARTMENT_OTHER): Payer: Self-pay | Admitting: Orthopedic Surgery

## 2021-06-24 NOTE — Anesthesia Postprocedure Evaluation (Signed)
Anesthesia Post Note  Patient: Tricia Hayes  Procedure(s) Performed: Right Achilles tendon lengthening (Right: Foot) Tibialis anterior tendon reconstruction (Right: Foot)     Patient location during evaluation: PACU Anesthesia Type: General and Regional Level of consciousness: awake and alert Pain management: pain level controlled Vital Signs Assessment: post-procedure vital signs reviewed and stable Respiratory status: spontaneous breathing, nonlabored ventilation, respiratory function stable and patient connected to nasal cannula oxygen Cardiovascular status: blood pressure returned to baseline and stable Postop Assessment: no apparent nausea or vomiting Anesthetic complications: no   No notable events documented.  Last Vitals:  Vitals:   06/13/21 1430 06/13/21 1449  BP: (!) 144/79 140/72  Pulse: 74 79  Resp: 12 14  Temp:  36.9 C  SpO2: 94% 93%    Last Pain:  Vitals:   06/14/21 1010  TempSrc:   PainSc: 4                  Sabrena Gavitt S

## 2021-07-09 ENCOUNTER — Ambulatory Visit (INDEPENDENT_AMBULATORY_CARE_PROVIDER_SITE_OTHER): Payer: Medicare PPO | Admitting: *Deleted

## 2021-07-09 DIAGNOSIS — J309 Allergic rhinitis, unspecified: Secondary | ICD-10-CM

## 2021-08-06 ENCOUNTER — Ambulatory Visit (INDEPENDENT_AMBULATORY_CARE_PROVIDER_SITE_OTHER): Payer: Medicare PPO | Admitting: *Deleted

## 2021-08-06 DIAGNOSIS — J309 Allergic rhinitis, unspecified: Secondary | ICD-10-CM | POA: Diagnosis not present

## 2021-09-05 DIAGNOSIS — J301 Allergic rhinitis due to pollen: Secondary | ICD-10-CM

## 2021-09-05 NOTE — Progress Notes (Signed)
VIALS MADE. EXP 09-05-22 °

## 2021-09-10 DIAGNOSIS — J3089 Other allergic rhinitis: Secondary | ICD-10-CM

## 2021-09-12 ENCOUNTER — Ambulatory Visit (INDEPENDENT_AMBULATORY_CARE_PROVIDER_SITE_OTHER): Payer: Medicare PPO

## 2021-09-12 DIAGNOSIS — J309 Allergic rhinitis, unspecified: Secondary | ICD-10-CM

## 2021-09-25 DIAGNOSIS — M2040 Other hammer toe(s) (acquired), unspecified foot: Secondary | ICD-10-CM | POA: Insufficient documentation

## 2021-09-26 ENCOUNTER — Ambulatory Visit (INDEPENDENT_AMBULATORY_CARE_PROVIDER_SITE_OTHER): Payer: Medicare PPO | Admitting: *Deleted

## 2021-09-26 DIAGNOSIS — J309 Allergic rhinitis, unspecified: Secondary | ICD-10-CM

## 2021-10-15 ENCOUNTER — Telehealth: Payer: Self-pay | Admitting: Allergy and Immunology

## 2021-10-15 NOTE — Telephone Encounter (Signed)
Left voicemail to schedule office visit due to being on allergy injections. Will send out a letter, as well.

## 2021-10-31 ENCOUNTER — Ambulatory Visit (INDEPENDENT_AMBULATORY_CARE_PROVIDER_SITE_OTHER): Payer: Medicare PPO

## 2021-10-31 DIAGNOSIS — J309 Allergic rhinitis, unspecified: Secondary | ICD-10-CM | POA: Diagnosis not present

## 2021-11-11 ENCOUNTER — Other Ambulatory Visit: Payer: Self-pay

## 2021-11-11 ENCOUNTER — Ambulatory Visit: Payer: Medicare PPO | Admitting: Allergy and Immunology

## 2021-11-11 ENCOUNTER — Encounter: Payer: Self-pay | Admitting: Allergy and Immunology

## 2021-11-11 VITALS — BP 140/84 | HR 80 | Resp 14 | Ht 62.5 in | Wt 190.6 lb

## 2021-11-11 DIAGNOSIS — H04123 Dry eye syndrome of bilateral lacrimal glands: Secondary | ICD-10-CM

## 2021-11-11 DIAGNOSIS — J309 Allergic rhinitis, unspecified: Secondary | ICD-10-CM | POA: Diagnosis not present

## 2021-11-11 DIAGNOSIS — J3089 Other allergic rhinitis: Secondary | ICD-10-CM

## 2021-11-11 MED ORDER — EPINEPHRINE 0.3 MG/0.3ML IJ SOAJ: INTRAMUSCULAR | 3 refills | Status: AC

## 2021-11-11 MED ORDER — AZELASTINE HCL 0.1 % NA SOLN: NASAL | 12 refills | Status: DC

## 2021-11-11 NOTE — Patient Instructions (Addendum)
°  1.  Continue immunotherapy (and Epi-Pen)  2.  Continue Flonase-2 sprays each nostril 1 time per day  3.  Continue montelukast 10 mg - 1 tablet 1 time per day  4.  Discontinue cetirizine  5.  If needed: Azelastine -1 spray each nostril 1-2 times per day  6.  If needed: Nasal saline spray multiple times a day  7.  Return to clinic in 1 year or earlier if problem

## 2021-11-11 NOTE — Progress Notes (Signed)
[facility] [place] [facility] [place] [facility] [place] [facility] [place]   Follow-up Note  Referring Provider: Gordan Payment., MD Primary Provider: Gordan Payment., MD Date of Office Visit: 11/11/2021  Subjective:   Tricia Hayes (DOB: 05/23/1946) is a 76 y.o. female who [facility] in re-evaluation of the following:  HPI: Tricia Hayes returns to this clinic in evaluation of allergic rhinitis.  She is undergoing a course of immunotherapy currently at every 4-week administration.  She has noticed a tremendous improvement regarding her allergic rhinoconjunctivitis while utilizing immunotherapy currently at every 4 weeks without any adverse effect and using Flonase and montelukast.  Her dry eye syndrome is being treated with serum eyedrops.  She still continues to use Zyrtec.  Allergies as of 11/11/2021       Reactions   Penicillins Swelling, Rash, Other (See Comments)   Mouth/tongue        Medication List    amphetamine-dextroamphetamine 15 MG 24 hr capsule Commonly known as: ADDERALL XR Take 15 mg by mouth daily as needed (focus/attention).   amphetamine-dextroamphetamine 5 MG 24 hr capsule Commonly known as: ADDERALL XR Take 5 mg by mouth daily as needed (focus/attention).   aspirin EC 81 MG tablet Take 81 mg by mouth every other day. Swallow whole.   atorvastatin 10 MG tablet Commonly known as: LIPITOR Take 10 mg by mouth at bedtime.   bisoprolol-hydrochlorothiazide 5-6.25 MG tablet Commonly known as: ZIAC Take 1 tablet by mouth daily.   buPROPion 300 MG 24 hr tablet Commonly known as: WELLBUTRIN XL Take 300 mg by mouth daily.   cetirizine 10 MG tablet Commonly known as: ZYRTEC Take 10 mg by mouth daily.   FLAX SEED OIL PO Take 1,400 mg by mouth at bedtime.   FLUoxetine 20 MG capsule Commonly known as: PROZAC Take 20 mg by mouth daily.   fluticasone 50 MCG/ACT nasal spray Commonly known as: FLONASE Place 1 spray into both  nostrils daily.   gabapentin 100 MG capsule Commonly known as: NEURONTIN Take 200 mg by mouth at bedtime.   meloxicam 7.5 MG tablet Commonly known as: MOBIC Take 7.5 mg by mouth daily as needed for pain.   montelukast 10 MG tablet Commonly known as: SINGULAIR Take 10 mg by mouth daily.   multivitamin with minerals Tabs tablet Take 1 tablet by mouth at bedtime. Centrum Silver   NON FORMULARY Place 1 drop into both eyes in the morning, at noon, in the evening, and at bedtime. Autologous Serum Therapy   Systane Complete 0.6 % Soln Generic drug: Propylene Glycol Place 1 drop into both eyes 4 (four) times daily as needed (severe dry eyes.).   tiZANidine 4 MG tablet Commonly known as: ZANAFLEX Take 4 mg by mouth 3 (three) times daily as needed (neck spasms).   TURMERIC PO Take by mouth.   Ubiquinol 100 MG Caps Take 100 mg by mouth daily.   zolpidem 10 MG tablet Commonly known as: AMBIEN Take 10 mg by mouth at bedtime.    Past Medical History:  Diagnosis Date   ADD (attention deficit disorder)    Allergic rhinitis    Arthritis    Depression    Hyperlipidemia    Hypertension    Pre-diabetes     Past Surgical History:  Procedure Laterality Date   ACHILLES TENDON LENGTHENING Right 06/13/2021   Procedure: Right Achilles tendon lengthening;  Surgeon: Toni Arthurs, MD;  Location: [facility];  Service: Orthopedics;  Laterality: Right;   bilateral joinit replacement on both thumbs      REVERSE SHOULDER ARTHROPLASTY Left 07/13/2020   Procedure: REVERSE SHOULDER ARTHROPLASTY;  Surgeon: Beverely Low, MD;  Location: [facility];  Service: Orthopedics;  Laterality: Left;   right knee arthroscopy      TENDON REPAIR Right 06/13/2021   Procedure: Tibialis anterior tendon reconstruction;  Surgeon: Toni Arthurs, MD;  Location: [facility];  Service: Orthopedics;  Laterality: Right;   TONSILLECTOMY      Review of systems negative except as noted in HPI /  PMHx or noted below:  Review of Systems  Constitutional: Negative.   HENT: Negative.    Eyes: Negative.   Respiratory: Negative.    Cardiovascular: Negative.   Gastrointestinal: Negative.   Genitourinary: Negative.   Musculoskeletal: Negative.   Skin: Negative.   Neurological: Negative.   Endo/Heme/Allergies: Negative.   Psychiatric/Behavioral: Negative.      Objective:   Vitals:   11/11/21 1531  BP: 140/84  Pulse: 80  Resp: 14  SpO2: 94%   Height: 5' 2.5" (158.8 cm)  Weight: 190 lb 9.6 oz (86.5 kg)   Physical Exam Constitutional:      Appearance: She is not diaphoretic.  HENT:     Head: Normocephalic.     Right Ear: Tympanic membrane, ear canal and external ear normal.     Left Ear: Tympanic membrane, ear canal and external ear normal.     Nose: Nose normal. No mucosal edema or rhinorrhea.     Mouth/Throat:     Pharynx: Uvula midline. No oropharyngeal exudate.  Eyes:     Conjunctiva/sclera: Conjunctivae normal.  Neck:     Thyroid: No thyromegaly.     Trachea: Trachea normal. No tracheal tenderness or tracheal deviation.  Cardiovascular:     Rate and Rhythm: Normal rate and regular rhythm.     Heart sounds: Normal heart sounds, S1 normal and S2 normal. No murmur heard. Pulmonary:     Effort: No respiratory distress.     Breath sounds: Normal breath sounds. No stridor. No wheezing or rales.  Lymphadenopathy:     Head:     Right side of head: No tonsillar adenopathy.     Left side of head: No tonsillar adenopathy.     Cervical: No cervical adenopathy.  Skin:    Findings: No erythema or rash.     Nails: There is no clubbing.  Neurological:     Mental Status: She is alert.    Diagnostics: none  Assessment and Plan:   1. Allergic rhinitis, unspecified seasonality, unspecified trigger   2. Perennial allergic rhinitis   3. Dry eye syndrome of both eyes     1.  Continue immunotherapy (and Epi-Pen)  2.  Continue Flonase-2 sprays each nostril 1 time per  day  3.  Continue montelukast 10 mg - 1 tablet 1 time per day  4.  Discontinue cetirizine  5.  If needed: Azelastine -1 spray each nostril 1-2 times per day  6.  If needed: Nasal saline spray multiple times a day  7.  Return to clinic in 1 year or earlier if problem  Randy will continue to use immunotherapy and anti-inflammatory agents for her airway to treat her atopic respiratory and conjunctival disease.  I did encourage her to discontinue her cetirizine as she has a very severe dry eye syndrome.  Assuming she does well with this plan I will see her back in this clinic in 1 year or earlier  if there is a problem.  Laurette Schimke, MD Allergy / Immunology [facility]

## 2021-11-12 ENCOUNTER — Encounter: Payer: Self-pay | Admitting: Allergy and Immunology

## 2021-11-28 ENCOUNTER — Ambulatory Visit (INDEPENDENT_AMBULATORY_CARE_PROVIDER_SITE_OTHER): Payer: Medicare PPO | Admitting: *Deleted

## 2021-11-28 DIAGNOSIS — J309 Allergic rhinitis, unspecified: Secondary | ICD-10-CM | POA: Diagnosis not present

## 2021-12-12 ENCOUNTER — Ambulatory Visit (INDEPENDENT_AMBULATORY_CARE_PROVIDER_SITE_OTHER): Payer: Medicare PPO | Admitting: *Deleted

## 2021-12-12 DIAGNOSIS — J309 Allergic rhinitis, unspecified: Secondary | ICD-10-CM

## 2021-12-26 ENCOUNTER — Ambulatory Visit (INDEPENDENT_AMBULATORY_CARE_PROVIDER_SITE_OTHER): Payer: Medicare PPO | Admitting: *Deleted

## 2021-12-26 DIAGNOSIS — J309 Allergic rhinitis, unspecified: Secondary | ICD-10-CM | POA: Diagnosis not present

## 2022-01-09 ENCOUNTER — Ambulatory Visit (INDEPENDENT_AMBULATORY_CARE_PROVIDER_SITE_OTHER): Payer: Medicare PPO | Admitting: *Deleted

## 2022-01-09 DIAGNOSIS — J309 Allergic rhinitis, unspecified: Secondary | ICD-10-CM

## 2022-01-23 ENCOUNTER — Ambulatory Visit (INDEPENDENT_AMBULATORY_CARE_PROVIDER_SITE_OTHER): Payer: Medicare PPO | Admitting: *Deleted

## 2022-01-23 DIAGNOSIS — J309 Allergic rhinitis, unspecified: Secondary | ICD-10-CM | POA: Diagnosis not present

## 2022-02-20 ENCOUNTER — Ambulatory Visit (INDEPENDENT_AMBULATORY_CARE_PROVIDER_SITE_OTHER): Payer: Medicare PPO | Admitting: *Deleted

## 2022-02-20 DIAGNOSIS — J309 Allergic rhinitis, unspecified: Secondary | ICD-10-CM | POA: Diagnosis not present

## 2022-03-13 ENCOUNTER — Ambulatory Visit (INDEPENDENT_AMBULATORY_CARE_PROVIDER_SITE_OTHER): Payer: Medicare PPO | Admitting: *Deleted

## 2022-03-13 DIAGNOSIS — J309 Allergic rhinitis, unspecified: Secondary | ICD-10-CM | POA: Diagnosis not present

## 2022-04-24 ENCOUNTER — Ambulatory Visit (INDEPENDENT_AMBULATORY_CARE_PROVIDER_SITE_OTHER): Payer: Medicare PPO | Admitting: *Deleted

## 2022-04-24 DIAGNOSIS — J309 Allergic rhinitis, unspecified: Secondary | ICD-10-CM

## 2022-05-28 ENCOUNTER — Ambulatory Visit: Payer: Medicare PPO | Admitting: Podiatry

## 2022-05-28 ENCOUNTER — Encounter: Payer: Self-pay | Admitting: Podiatry

## 2022-05-28 ENCOUNTER — Ambulatory Visit (INDEPENDENT_AMBULATORY_CARE_PROVIDER_SITE_OTHER): Payer: Medicare PPO

## 2022-05-28 DIAGNOSIS — M7741 Metatarsalgia, right foot: Secondary | ICD-10-CM

## 2022-05-28 DIAGNOSIS — Z9889 Other specified postprocedural states: Secondary | ICD-10-CM

## 2022-05-28 DIAGNOSIS — M2041 Other hammer toe(s) (acquired), right foot: Secondary | ICD-10-CM | POA: Diagnosis not present

## 2022-05-28 DIAGNOSIS — M2042 Other hammer toe(s) (acquired), left foot: Secondary | ICD-10-CM

## 2022-05-28 DIAGNOSIS — Q6671 Congenital pes cavus, right foot: Secondary | ICD-10-CM | POA: Diagnosis not present

## 2022-05-28 DIAGNOSIS — M79671 Pain in right foot: Secondary | ICD-10-CM

## 2022-05-28 DIAGNOSIS — M216X1 Other acquired deformities of right foot: Secondary | ICD-10-CM

## 2022-05-28 NOTE — Progress Notes (Signed)
  Subjective:  Patient ID: Tricia Hayes, female    DOB: [birth year],   MRN: [id number]  Chief Complaint  Patient presents with   Tricia Hayes    Patient came in today for right foot hammertoes, patient had surg to repair a tendon a year ago from an accident, pt now has hammertoes and a smaller foot, pateint would like a 2nd opinion to help with the hammertoes      76 y.o. female presents for concern and second opinion of right foot pain. Relates a year ago she had her anterior tibial tendon repairs with Dr. Victorino Dike. Relates she is still having swelling in the foot and relates hammertoes and calluses. Relates not walking well and pain at the ball of the foot. Relates she has been in PT and has used toe spacers without much relief.  Denies any other pedal complaints. Denies n/v/f/c.   Past Medical History:  Diagnosis Date   ADD (attention deficit disorder)    Allergic rhinitis    Arthritis    Depression    Hyperlipidemia    Hypertension    Pre-diabetes     Objective:  Physical Exam: Vascular: DP/PT pulses 2/4 bilateral. CFT <3 seconds. Normal hair growth on digits. No edema.  Skin. No lacerations or abrasions bilateral feet.  Musculoskeletal: MMT 5/5 bilateral lower extremities in DF, PF, Inversion and Eversion. Deceased ROM in DF of ankle joint. No specific areas of tenderness to palpation more concern with gait. Upon gait noted increased dorsiflexion of the digits with swing phase. Prominent first metatarsal plantar flexion. Pes cavus upon standing noted driven by plantarflexed first ray.  Neurological: Sensation intact to light touch.   Assessment:   1. Metatarsalgia of right foot   2. Acquired hammertoes of both feet   3. S/P tendon repair   4. Congenital pes cavus, right foot   5. Plantar flexed metatarsal bone of right foot      Plan:  Patient was evaluated and treated and all questions answered. Discussed previous tendon repair of foot and appears muscles have great  strength and no concern for residual drop foot. She does have hammering of the digits that have worsened and likely a product of the increase work for [name] tendon. No pain currently but discussed if this becomes a problem surgery for hammertoe repair maybe necessary.  Discussed pes cavus and this causing more pain in the ball of the foot. Suggested starting off with orthotics to help with this. Gave recommendations and will consider CMO.  Discussed surgery maybe needed at some point but will try conservative measures first.  Patient to return as needed.   Louann Sjogren, DPM

## 2022-05-29 ENCOUNTER — Ambulatory Visit (INDEPENDENT_AMBULATORY_CARE_PROVIDER_SITE_OTHER): Payer: Medicare PPO | Admitting: *Deleted

## 2022-05-29 DIAGNOSIS — J309 Allergic rhinitis, unspecified: Secondary | ICD-10-CM | POA: Diagnosis not present

## 2022-06-04 ENCOUNTER — Other Ambulatory Visit: Payer: Self-pay | Admitting: Podiatry

## 2022-06-04 DIAGNOSIS — M7741 Metatarsalgia, right foot: Secondary | ICD-10-CM

## 2022-06-25 ENCOUNTER — Ambulatory Visit (INDEPENDENT_AMBULATORY_CARE_PROVIDER_SITE_OTHER): Payer: Medicare PPO | Admitting: *Deleted

## 2022-06-25 DIAGNOSIS — J309 Allergic rhinitis, unspecified: Secondary | ICD-10-CM | POA: Diagnosis not present

## 2022-06-25 NOTE — Progress Notes (Signed)
VIALS EXP 06-26-23 

## 2022-06-26 DIAGNOSIS — J302 Other seasonal allergic rhinitis: Secondary | ICD-10-CM | POA: Diagnosis not present

## 2022-06-27 DIAGNOSIS — J3089 Other allergic rhinitis: Secondary | ICD-10-CM | POA: Diagnosis not present

## 2022-07-17 ENCOUNTER — Ambulatory Visit (INDEPENDENT_AMBULATORY_CARE_PROVIDER_SITE_OTHER): Payer: Medicare PPO | Admitting: *Deleted

## 2022-07-17 DIAGNOSIS — J309 Allergic rhinitis, unspecified: Secondary | ICD-10-CM | POA: Diagnosis not present

## 2022-08-21 ENCOUNTER — Ambulatory Visit (INDEPENDENT_AMBULATORY_CARE_PROVIDER_SITE_OTHER): Payer: Medicare PPO | Admitting: *Deleted

## 2022-08-21 DIAGNOSIS — J309 Allergic rhinitis, unspecified: Secondary | ICD-10-CM | POA: Diagnosis not present

## 2022-09-11 ENCOUNTER — Ambulatory Visit (INDEPENDENT_AMBULATORY_CARE_PROVIDER_SITE_OTHER): Payer: Medicare PPO | Admitting: *Deleted

## 2022-09-11 DIAGNOSIS — J309 Allergic rhinitis, unspecified: Secondary | ICD-10-CM | POA: Diagnosis not present

## 2022-10-10 DIAGNOSIS — G454 Transient global amnesia: Secondary | ICD-10-CM | POA: Insufficient documentation

## 2022-11-06 ENCOUNTER — Ambulatory Visit (INDEPENDENT_AMBULATORY_CARE_PROVIDER_SITE_OTHER): Payer: Medicare PPO | Admitting: *Deleted

## 2022-11-06 DIAGNOSIS — J309 Allergic rhinitis, unspecified: Secondary | ICD-10-CM | POA: Diagnosis not present

## 2022-11-20 ENCOUNTER — Ambulatory Visit (INDEPENDENT_AMBULATORY_CARE_PROVIDER_SITE_OTHER): Payer: Medicare PPO | Admitting: *Deleted

## 2022-11-20 DIAGNOSIS — J309 Allergic rhinitis, unspecified: Secondary | ICD-10-CM | POA: Diagnosis not present

## 2022-12-04 ENCOUNTER — Ambulatory Visit: Payer: Medicare PPO | Admitting: Neurology

## 2022-12-15 HISTORY — PX: OTHER SURGICAL HISTORY: SHX169

## 2022-12-18 ENCOUNTER — Ambulatory Visit (INDEPENDENT_AMBULATORY_CARE_PROVIDER_SITE_OTHER): Payer: Medicare PPO

## 2022-12-18 DIAGNOSIS — J309 Allergic rhinitis, unspecified: Secondary | ICD-10-CM

## 2023-01-02 ENCOUNTER — Other Ambulatory Visit: Payer: Self-pay | Admitting: Allergy and Immunology

## 2023-01-08 DIAGNOSIS — M1990 Unspecified osteoarthritis, unspecified site: Secondary | ICD-10-CM | POA: Insufficient documentation

## 2023-01-13 ENCOUNTER — Encounter: Payer: Self-pay | Admitting: Neurology

## 2023-01-13 ENCOUNTER — Ambulatory Visit: Payer: Medicare PPO | Admitting: Neurology

## 2023-01-13 VITALS — BP 132/81 | HR 70 | Ht 63.6 in | Wt 188.0 lb

## 2023-01-13 DIAGNOSIS — G454 Transient global amnesia: Secondary | ICD-10-CM

## 2023-01-13 DIAGNOSIS — R413 Other amnesia: Secondary | ICD-10-CM

## 2023-01-13 NOTE — Patient Instructions (Signed)
Continue current medications  Follow up in 6 months, at that time, will do a more memory evaluation

## 2023-01-13 NOTE — Progress Notes (Signed)
[facility]  PATIENT: Tricia Hayes DOB: October 07, 1945  REQUESTING CLINICIAN: Brown-Patram, Melissa J* HISTORY FROM: Patient  REASON FOR VISIT: TGA    HISTORICAL  CHIEF COMPLAINT:  Chief Complaint  Patient presents with   New Patient (Initial Visit)    Rm 12. Alone. NP/Paper/[facility] [place]/[name] NP/transient global amnesia.    HISTORY OF PRESENT ILLNESS:  This is a 77 year old woman past medical history of anxiety/depression, hypertension, hyperlipidemia, insomnia, and ADD who is presenting after an event of memory loss.  Patient reports in January she was taken to the hospital for episode of memory loss that lasted about 3 hours.  She does not remember anything from that 3 hours but was told by her husband that she was asking the same questions, inability to remember and she was told that  she was repeating "I think I am going into a brain fog".  It seems her husband went to the store around 1PM and upon his return around 3 PM he found patient standing in the kitchen, first not helping him with the grocery which was unusual for her, then being repetitive and asking the same questions. Husband felt like something was wrong and called EMS.  Patient feels that her memory got back to normal in the hospital when her son and daughter-in-law came to visit her.  Her workup including MRI was negative for any acute stroke.  Her EEG showed transient left slowing but no epileptiform discharges, she was back to her baseline and she was discharged home.  Since then she is back to her normal self,  has not had any additional events.  She reported 12 years ago after a stressful event during a funeral. She forgot that she had to go to a funeral service, again she was taken to the hospital and diagnosed with acute acute stress reaction. Her other concern is her memory.  She has a her long history of depression.  Currently she is on Wellbutrin and fluoxetine.    OTHER MEDICAL  CONDITIONS: Anxiety/Depression, ADD, Hypertension, Hyperlipidemia, Insomnia   REVIEW OF SYSTEMS: Full 14 system review of systems performed and negative with exception of: As noted in the HPI   ALLERGIES: Allergies  Allergen Reactions   Penicillins Swelling, Rash and Other (See Comments)    Mouth/tongue    HOME MEDICATIONS: Outpatient Medications Prior to Visit  Medication Sig Dispense Refill   amphetamine-dextroamphetamine (ADDERALL XR) 15 MG 24 hr capsule Take 15 mg by mouth daily as needed (focus/attention).      aspirin EC 81 MG tablet Take 81 mg by mouth every other day. Swallow whole.      atorvastatin (LIPITOR) 10 MG tablet Take 10 mg by mouth at bedtime.      azelastine (ASTELIN) 0.1 % nasal spray SPRAY ONCE IN EACH NOSTRIL 1 TO 2 TIMES A DAY AS NEEDED 30 mL 0   bisoprolol-hydrochlorothiazide (ZIAC) 5-6.25 MG tablet Take 1 tablet by mouth daily.     buPROPion (WELLBUTRIN XL) 300 MG 24 hr tablet Take 300 mg by mouth daily.     Coenzyme Q10 (COQ10) 100 MG CAPS Take by mouth.     Cyanocobalamin (B-12) 3000 MCG CAPS Take 3,000 mcg by mouth daily.     Flaxseed, Linseed, (FLAX SEED OIL PO) Take 1,400 mg by mouth at bedtime.     FLUoxetine (PROZAC) 20 MG capsule Take 30 mg by mouth daily.     fluticasone (FLONASE) 50 MCG/ACT nasal spray Place 1 spray into both  nostrils daily.     gabapentin (NEURONTIN) 100 MG capsule Take 200 mg by mouth at bedtime.     montelukast (SINGULAIR) 10 MG tablet Take 10 mg by mouth daily.     Multiple Vitamin (MULTIVITAMIN WITH MINERALS) TABS tablet Take 1 tablet by mouth at bedtime. Centrum Silver     NON FORMULARY Place 1 drop into both eyes in the morning, at noon, in the evening, and at bedtime. Autologous Serum Therapy     Propylene Glycol (SYSTANE COMPLETE) 0.6 % SOLN Place 1 drop into both eyes 4 (four) times daily as needed (severe dry eyes.).     tiZANidine (ZANAFLEX) 4 MG tablet Take 4 mg by mouth 3 (three) times daily as needed (neck spasms).       UNABLE TO FIND 750 mg. Med Name: curamed     zolpidem (AMBIEN) 10 MG tablet Take 10 mg by mouth at bedtime.      amphetamine-dextroamphetamine (ADDERALL XR) 5 MG 24 hr capsule Take 5 mg by mouth daily as needed (focus/attention).  (Patient not taking: Reported on 01/13/2023)     EPINEPHrine (EPIPEN 2-PAK) 0.3 mg/0.3 mL IJ SOAJ injection Use as directed for life-threatening allergic reaction. (Patient not taking: Reported on 01/13/2023) 1 each 3   cetirizine (ZYRTEC) 10 MG tablet Take 10 mg by mouth daily.     Ubiquinol 100 MG CAPS Take 100 mg by mouth daily.     No facility-administered medications prior to visit.    PAST MEDICAL HISTORY: Past Medical History:  Diagnosis Date   ADD (attention deficit disorder)    Allergic rhinitis    Arthritis    Depression    Hyperlipidemia    Hypertension    Pre-diabetes     PAST SURGICAL HISTORY: Past Surgical History:  Procedure Laterality Date   ACHILLES TENDON LENGTHENING Right 06/13/2021   Procedure: Right Achilles tendon lengthening;  Surgeon: Toni Arthurs, MD;  Location: [facility];  Service: Orthopedics;  Laterality: Right;   bilateral joinit replacement on both thumbs      REVERSE SHOULDER ARTHROPLASTY Left 07/13/2020   Procedure: REVERSE SHOULDER ARTHROPLASTY;  Surgeon: Beverely Low, MD;  Location: [facility];  Service: Orthopedics;  Laterality: Left;   right knee arthroscopy      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 No pertinent family history.  SOCIAL HISTORY: Social History   Socioeconomic History   Marital status: Married    Spouse name: Not on file   Number of children: 1   Years of education: 16   Highest education level: Not on file   Occupational History   Not on file  Tobacco Use   Smoking status: Never   Smokeless tobacco: Never  Vaping Use   Vaping Use: Never used  Substance and Sexual Activity   Alcohol use: Yes    Comment: 3-6 glasses of wine per pweek    Drug use: Not on file   Sexual activity: Not on file  Other Topics Concern   Not on file  Social History Narrative   Not on file   Social Determinants of Health   Financial Resource Strain: Not on file  Food Insecurity: Not on file  Transportation Needs: Not on file  Physical Activity:  Not on file  Stress: Not on file  Social Connections: Not on file  Intimate Partner Violence: Not on file    PHYSICAL EXAM  GENERAL EXAM/CONSTITUTIONAL: Vitals:  Vitals:   01/13/23 0853  BP: 132/81  Pulse: 70  Weight: 188 lb (85.3 kg)  Height: 5' 3.6" (1.615 m)   Body mass index is 32.68 kg/m. Wt Readings from Last 3 Encounters:  01/13/23 188 lb (85.3 kg)  11/11/21 190 lb 9.6 oz (86.5 kg)  06/13/21 194 lb 0.1 oz (88 kg)   Patient is in no distress; well developed, nourished and groomed; neck is supple  MUSCULOSKELETAL: Gait, strength, tone, movements noted in Neurologic exam below  NEUROLOGIC: MENTAL STATUS:      No data to display         awake, alert, oriented to person, place and time recent and remote memory intact. 3/3 registration and 1/3 recall  normal attention and concentration language fluent, comprehension intact, naming intact fund of knowledge appropriate  CRANIAL NERVE:  2nd, 3rd, 4th, 6th - Visual fields full to confrontation, extraocular muscles intact, no nystagmus 5th - facial sensation symmetric 7th - facial strength symmetric 8th - hearing intact 9th - palate elevates symmetrically, uvula midline 11th - shoulder shrug symmetric 12th - tongue protrusion midline  MOTOR:  normal bulk and tone, full strength in the BUE, BLE  SENSORY:  normal and symmetric to light touch  COORDINATION:  finger-nose-finger, fine  finger movements normal  REFLEXES:  deep tendon reflexes present and symmetric  GAIT/STATION:  normal   DIAGNOSTIC DATA (LABS, IMAGING, TESTING) - I reviewed patient records, labs, notes, testing and imaging myself where available.  Lab Results  Component Value Date   WBC 5.8 07/04/2020   HGB 10.8 (L) 07/14/2020   HCT 32.1 (L) 07/14/2020   MCV 95.0 07/04/2020   PLT 227 07/04/2020      Component Value Date/Time   NA 136 06/07/2021 1509   K 4.6 06/07/2021 1509   CL 101 06/07/2021 1509   CO2 29 06/07/2021 1509   GLUCOSE 115 (H) 06/07/2021 1509   BUN 11 06/07/2021 1509   CREATININE 0.90 06/07/2021 1509   CALCIUM 9.5 06/07/2021 1509   GFRNONAA >60 06/07/2021 1509   No results found for: "CHOL", "HDL", "LDLCALC", "LDLDIRECT", "TRIG", "CHOLHDL" No results found for: "HGBA1C" No results found for: "VITAMINB12" No results found for: "TSH"   EEG October 03 2022 Drowsiness only was achieved No clear epileptiform activity was noted and therefore no definitive evidence was noted for seizure disorder The slow transit noted in the left temporal area indicate a slight focal neurophysiological disturbance in the left temporal region  CT head October 02 2022 No acute intracranial abnormalities.  MRI brain October 02 2022 Unremarkable for age brain MRI with no acute intracranial pathology.    ASSESSMENT AND PLAN  77 y.o. year old female with anxiety/depression, hypertension, hyperlipidemia, ADD and insomnia who is presenting after an episode of memory loss.  MRI negative for any acute stroke, EEG negative for seizure.  Patient presentation was consistent with TGA, discussed the diagnosis with her.  No further workup indicated.  She will continue her current medications. For her ongoing depression and anxiety, she is on fluoxetine 30 mg daily and Wellbutrin 300 mg daily, I have advised her to discuss with her prescribing doctor to see if she can go up on the medicine to fluoxetine  60 mg daily and Wellbutrin 450 mg daily.  She does also complain of memory loss.  She will follow-up with me in 6 months at that time we will do a formal memory evaluation.  She is comfortable with plan. Follow up in 6 months or sooner if worse.   1. TGA (transient global amnesia)   2. Memory change      Patient Instructions  Continue current medications  Follow up in 6 months, at that time, will do a more memory evaluation    No orders of the defined types were placed in this encounter.   No orders of the defined types were placed in this encounter.   Return in about 6 months (around 07/15/2023).  I have spent a total of 70 minutes dedicated to this patient today, preparing to see patient, performing a medically appropriate examination and evaluation, ordering tests and/or medications and procedures, and counseling and educating the patient/family/caregiver; independently interpreting result and communicating results to the family/patient/caregiver; and documenting clinical information in the electronic medical record.   Windell Norfolk, MD 01/13/2023, 10:05 AM  [facility] [address] [place], [place] [address] [phone]

## 2023-01-14 ENCOUNTER — Encounter: Payer: Self-pay | Admitting: Neurology

## 2023-01-15 ENCOUNTER — Ambulatory Visit (INDEPENDENT_AMBULATORY_CARE_PROVIDER_SITE_OTHER): Payer: Medicare PPO | Admitting: *Deleted

## 2023-01-15 DIAGNOSIS — J309 Allergic rhinitis, unspecified: Secondary | ICD-10-CM

## 2023-02-12 ENCOUNTER — Ambulatory Visit (INDEPENDENT_AMBULATORY_CARE_PROVIDER_SITE_OTHER): Payer: Medicare PPO | Admitting: *Deleted

## 2023-02-12 DIAGNOSIS — J309 Allergic rhinitis, unspecified: Secondary | ICD-10-CM | POA: Diagnosis not present

## 2023-04-01 ENCOUNTER — Ambulatory Visit (INDEPENDENT_AMBULATORY_CARE_PROVIDER_SITE_OTHER): Payer: Medicare PPO | Admitting: *Deleted

## 2023-04-01 DIAGNOSIS — J309 Allergic rhinitis, unspecified: Secondary | ICD-10-CM | POA: Diagnosis not present

## 2023-04-16 ENCOUNTER — Ambulatory Visit (INDEPENDENT_AMBULATORY_CARE_PROVIDER_SITE_OTHER): Payer: Medicare PPO | Admitting: *Deleted

## 2023-04-16 DIAGNOSIS — J309 Allergic rhinitis, unspecified: Secondary | ICD-10-CM

## 2023-05-28 ENCOUNTER — Ambulatory Visit (INDEPENDENT_AMBULATORY_CARE_PROVIDER_SITE_OTHER): Payer: Medicare PPO | Admitting: *Deleted

## 2023-05-28 DIAGNOSIS — J309 Allergic rhinitis, unspecified: Secondary | ICD-10-CM | POA: Diagnosis not present

## 2023-06-01 ENCOUNTER — Other Ambulatory Visit: Payer: Self-pay | Admitting: Allergy and Immunology

## 2023-06-11 ENCOUNTER — Ambulatory Visit: Payer: Medicare PPO | Admitting: Allergy and Immunology

## 2023-06-18 ENCOUNTER — Ambulatory Visit: Payer: Medicare PPO | Admitting: Allergy and Immunology

## 2023-06-18 ENCOUNTER — Encounter: Payer: Self-pay | Admitting: Allergy and Immunology

## 2023-06-18 VITALS — BP 124/80 | HR 72 | Resp 16 | Ht 61.6 in | Wt 186.2 lb

## 2023-06-18 DIAGNOSIS — J309 Allergic rhinitis, unspecified: Secondary | ICD-10-CM

## 2023-06-18 DIAGNOSIS — H04123 Dry eye syndrome of bilateral lacrimal glands: Secondary | ICD-10-CM

## 2023-06-18 DIAGNOSIS — J3089 Other allergic rhinitis: Secondary | ICD-10-CM | POA: Diagnosis not present

## 2023-06-18 MED ORDER — AZELASTINE HCL 0.1 % NA SOLN: NASAL | 3 refills | Status: AC

## 2023-06-18 NOTE — Progress Notes (Signed)
[place] - [place] - [place] - [place] - [place]   Follow-up Note  Referring Provider: Rhea Bleacher* Primary Provider: Krystal Clark, NP Date of Office Visit: 06/18/2023  Subjective:   Tricia Hayes (DOB: 1946-05-19) is a 77 y.o. female who [facility] in re-evaluation of the following:  HPI: Tricia Hayes returns to this clinic in evaluation of allergic rhinitis.  I last saw her in this clinic 11 November 2021.  She continues on immunotherapy every 4 weeks without any adverse effect and this has resulted in very significant improvement regarding her allergic rhinitis and she also continues to use Flonase and montelukast on a pretty consistent basis and has eliminated the use of oral antihistamines because of her dry eye syndrome.  Occasionally she will use azelastine nasal spray.  She has not required a systemic steroid or an antibiotic for any type of airway issue.  She has obtained this years flu vaccine.  Allergies as of 06/18/2023       Reactions   Penicillins Swelling, Rash, Other (See Comments)   Mouth/tongue        Medication List    amphetamine-dextroamphetamine 15 MG 24 hr capsule Commonly known as: ADDERALL XR Take 15 mg by mouth daily as needed (focus/attention).   aspirin EC 81 MG tablet Take 81 mg by mouth every other day. Swallow whole.   atorvastatin 10 MG tablet Commonly known as: LIPITOR Take 10 mg by mouth at bedtime.   azelastine 0.1 % nasal spray Commonly known as: ASTELIN SPRAY ONCE IN EACH NOSTRIL 1 TO 2 TIMES A DAY AS NEEDED   bisoprolol-hydrochlorothiazide 5-6.25 MG tablet Commonly known as: ZIAC Take 1 tablet by mouth daily.   buPROPion HCl ER (XL) 450 MG Tb24 Take 1 tablet by mouth daily.   CoQ10 100 MG Caps Take by mouth.   EPINEPHrine 0.3 mg/0.3 mL Soaj injection Commonly known as: EpiPen 2-Pak Use as directed for life-threatening allergic reaction.   FLAX  SEED OIL PO Take 1,400 mg by mouth at bedtime.   FLUoxetine 20 MG capsule Commonly known as: PROZAC Take 20 mg by mouth 2 (two) times daily.   fluticasone 50 MCG/ACT nasal spray Commonly known as: FLONASE Place 1 spray into both nostrils daily.   gabapentin 100 MG capsule Commonly known as: NEURONTIN Take 200 mg by mouth at bedtime.   montelukast 10 MG tablet Commonly known as: SINGULAIR Take 10 mg by mouth daily.   multivitamin with minerals Tabs tablet Take 1 tablet by mouth at bedtime. Centrum Silver   NON FORMULARY Place 1 drop into both eyes in the morning, at noon, in the evening, and at bedtime. Autologous Serum Therapy   NUTRITIONAL SUPPLEMENT PO Take by mouth 2 (two) times daily. InflamMove supplement   Systane Complete 0.6 % Soln Generic drug: Propylene Glycol Place 1 drop into both eyes 4 (four) times daily as needed (severe dry eyes.).   tiZANidine 4 MG tablet Commonly known as: ZANAFLEX Take 4 mg by mouth 3 (three) times daily as needed (neck spasms).   zolpidem 10 MG tablet Commonly known as: AMBIEN Take 10 mg by mouth at bedtime.    Past Medical History:  Diagnosis Date   ADD (attention deficit disorder)    Allergic rhinitis    Arthritis    Depression    Hyperlipidemia    Hypertension    Melanoma of upper arm, right (HCC)    Pre-diabetes    Squamous cell  carcinoma of lip     Past Surgical History:  Procedure Laterality Date   ACHILLES TENDON LENGTHENING Right 06/13/2021   Procedure: Right Achilles tendon lengthening;  Surgeon: Toni Arthurs, MD;  Location: [facility];  Service: Orthopedics;  Laterality: Right;   bilateral joinit replacement on both thumbs      MELANOMA EXCISION Right    Arm   REVERSE SHOULDER ARTHROPLASTY Left 07/13/2020   Procedure: REVERSE SHOULDER ARTHROPLASTY;  Surgeon: Beverely Low, MD;  Location: [facility];  Service: Orthopedics;  Laterality: Left;   right knee arthroscopy      squamous cell carcinoma  removal  12/2022   from lip   TENDON REPAIR Right 06/13/2021   Procedure: Tibialis anterior tendon reconstruction;  Surgeon: Toni Arthurs, MD;  Location:  S[facility];  Service: Orthopedics;  Laterality: Right;   TONSILLECTOMY      Review of systems negative except as noted in HPI / PMHx or noted below:  Review of Systems  Constitutional: Negative.   HENT: Negative.    Eyes: Negative.   Respiratory: Negative.    Cardiovascular: Negative.   Gastrointestinal: Negative.   Genitourinary: Negative.   Musculoskeletal: Negative.   Skin: Negative.   Neurological: Negative.   Endo/Heme/Allergies: Negative.   Psychiatric/Behavioral: Negative.       Objective:   Vitals:   06/18/23 1144  BP: 124/80  Pulse: 72  Resp: 16  SpO2: 94%   Height: 5' 1.6" (156.5 cm)  Weight: 186 lb 3.2 oz (84.5 kg)   Physical Exam Constitutional:      Appearance: She is not diaphoretic.  HENT:     Head: Normocephalic.     Right Ear: Tympanic membrane, ear canal and external ear normal.     Left Ear: Tympanic membrane, ear canal and external ear normal.     Nose: Nose normal. No mucosal edema or rhinorrhea.     Mouth/Throat:     Pharynx: Uvula midline. No oropharyngeal exudate.  Eyes:     Conjunctiva/sclera: Conjunctivae normal.  Neck:     Thyroid: No thyromegaly.     Trachea: Trachea normal. No tracheal tenderness or tracheal deviation.  Cardiovascular:     Rate and Rhythm: Normal rate and regular rhythm.     Heart sounds: Normal heart sounds, S1 normal and S2 normal. No murmur heard. Pulmonary:     Effort: No respiratory distress.     Breath sounds: Normal breath sounds. No stridor. No wheezing or rales.  Lymphadenopathy:     Head:     Right side of head: No tonsillar adenopathy.     Left side of head: No tonsillar adenopathy.     Cervical: No cervical adenopathy.  Skin:    Findings: No erythema or rash.     Nails: There is no clubbing.  Neurological:     Mental Status:  She is alert.     Diagnostics: none    Assessment and Plan:   1. Perennial allergic rhinitis   2. Dry eye syndrome of both eyes   3. Allergic rhinitis, unspecified seasonality, unspecified trigger    1.  Continue immunotherapy (and Epi-Pen)  2.  Continue Flonase-2 sprays each nostril 1 time per day  3.  Continue montelukast 10 mg - 1 tablet 1 time per day  4.  If needed: Azelastine -1 spray each nostril 1-2 times per day  5.  If needed: Nasal saline spray multiple times a day  6.  Return to clinic in 1 year or earlier  if problem  Cova appears to be doing quite well and she will remain on immunotherapy and other anti-inflammatory agents for her airway as noted above and we will see her back in this clinic in 1 year or earlier if there is a problem.  Laurette Schimke, MD Allergy / Immunology  [facility]

## 2023-06-18 NOTE — Patient Instructions (Signed)
  1.  Continue immunotherapy (and Epi-Pen)  2.  Continue Flonase-2 sprays each nostril 1 time per day  3.  Continue montelukast 10 mg - 1 tablet 1 time per day  4.  If needed: Azelastine -1 spray each nostril 1-2 times per day  5.  If needed: Nasal saline spray multiple times a day  6.  Return to clinic in 1 year or earlier if problem

## 2023-06-22 ENCOUNTER — Encounter: Payer: Self-pay | Admitting: Allergy and Immunology

## 2023-06-22 DIAGNOSIS — J302 Other seasonal allergic rhinitis: Secondary | ICD-10-CM | POA: Diagnosis not present

## 2023-06-22 NOTE — Progress Notes (Signed)
VIALS EXP 06-21-24

## 2023-06-23 DIAGNOSIS — J301 Allergic rhinitis due to pollen: Secondary | ICD-10-CM | POA: Diagnosis not present

## 2023-07-04 IMAGING — MR MR ANKLE*R* W/O CM
5 series · 39 of 40 positions shown · non-contrast
Comparison: Radiographs 12/02/2017

CLINICAL DATA: Pain in joint. No other history provided.

EXAM:
MRI OF THE RIGHT ANKLE WITHOUT CONTRAST
TECHNIQUE: Multiplanar, multisequence MR imaging of the ankle was performed. No
intravenous contrast was administered.

[Series 6: T2 fat-sat · axial · 3.0mm · 0.50mm/px · z∈[-94,+27]mm · 9 of 32 slices shown (1 of 2)]
[im 1/32]
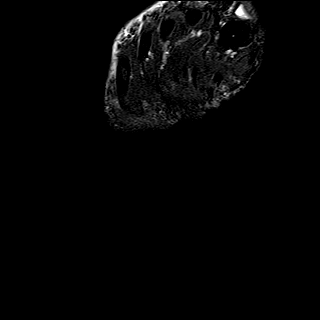
[im 4/32]
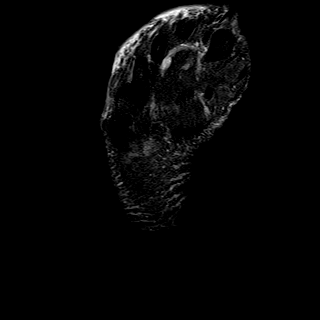
[im 8/32]
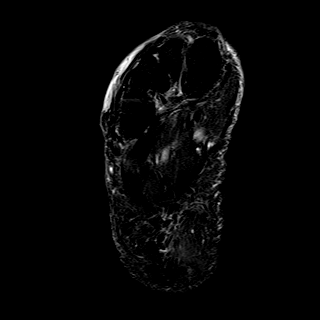
[im 12/32]
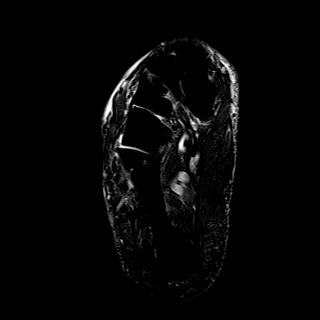
[im 16/32]
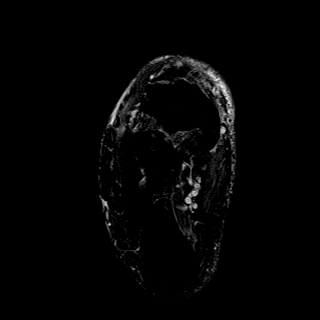
[im 20/32]
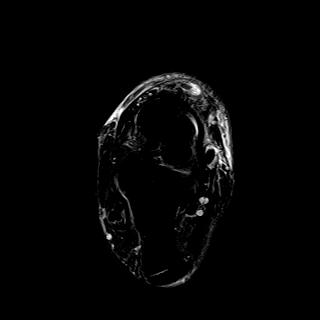
[im 24/32]
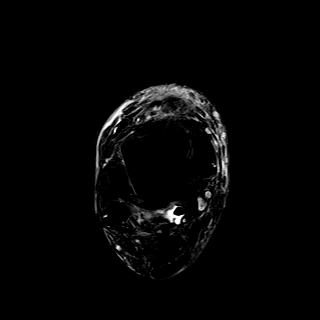
[im 28/32]
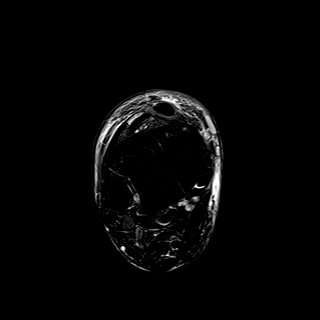
[im 32/32]
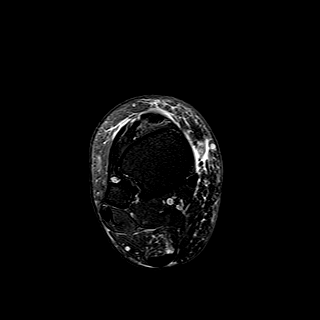

[Series 7: PD fat-sat · axial · 3.0mm · 0.50mm/px · z∈[-94,+27]mm · 9 of 32 slices shown]
[im 1/32]
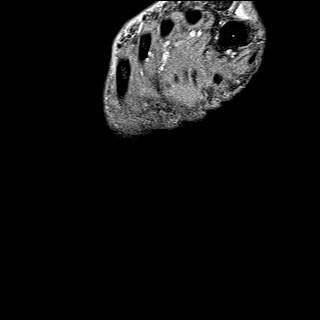
[im 4/32]
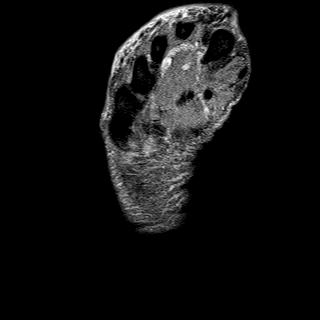
[im 8/32]
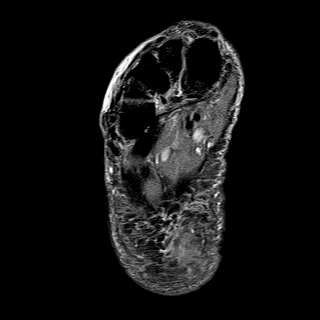
[im 12/32]
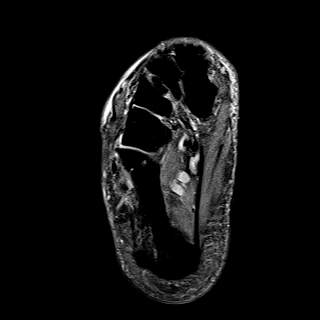
[im 16/32]
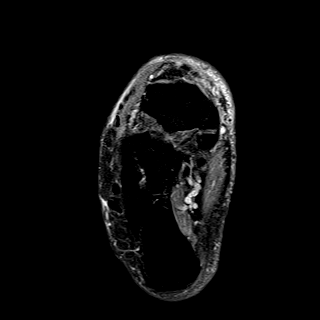
[im 20/32]
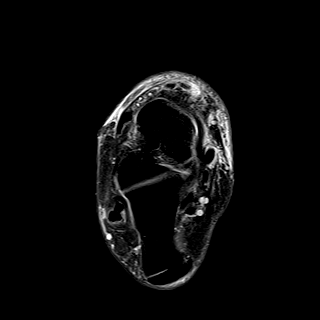
[im 24/32]
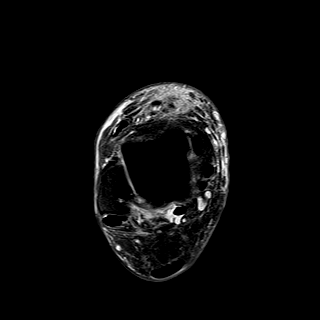
[im 28/32]
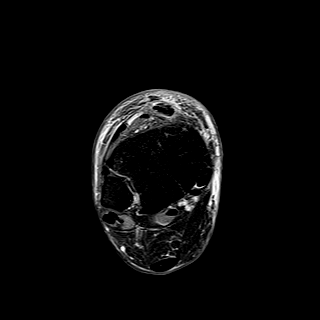
[im 32/32]
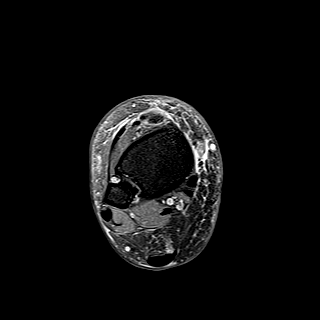

[Series 8: T1 · sagittal · 4.0mm · 0.56mm/px · 6 of 22 slices shown]
[im 1/22]
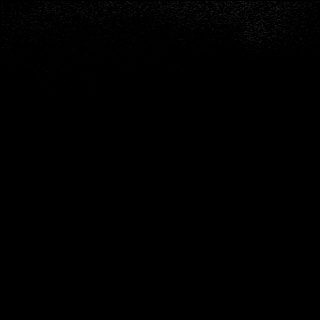
[im 5/22]
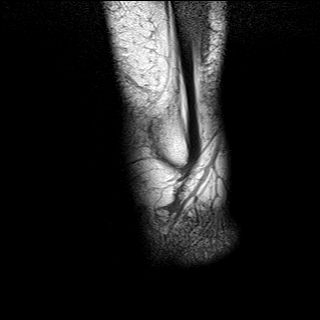
[im 9/22]
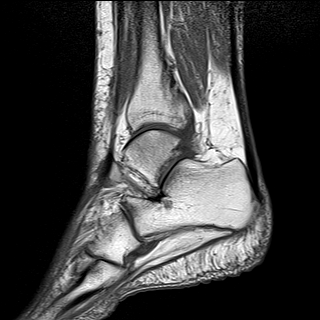
[im 13/22]
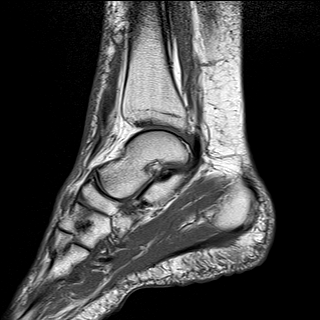
[im 17/22]
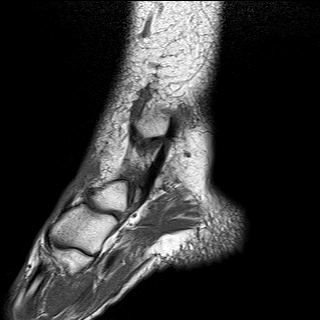
[im 22/22]
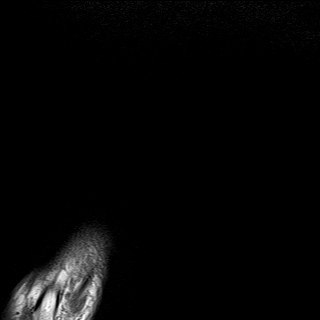

[Series 9: STIR · sagittal · 4.0mm · 0.35mm/px · 6 of 22 slices shown]
[im 1/22]
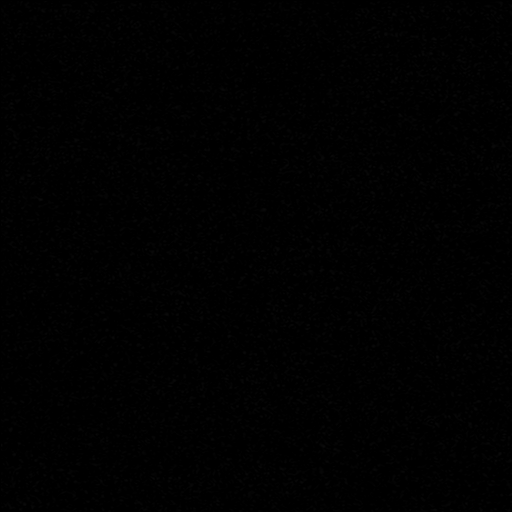
[im 5/22]
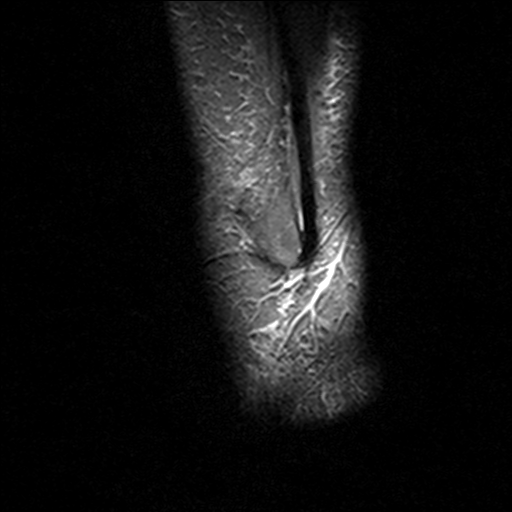
[im 9/22]
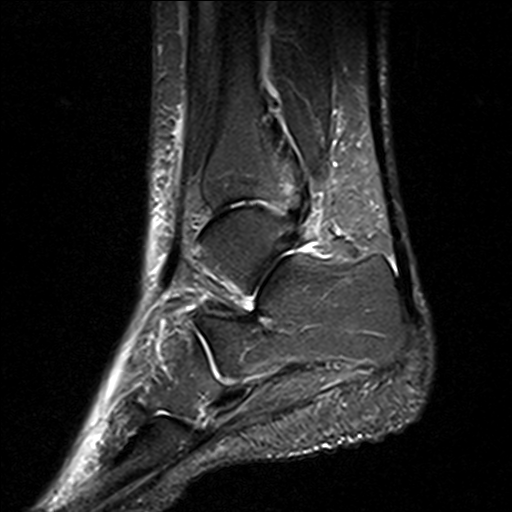
[im 13/22]
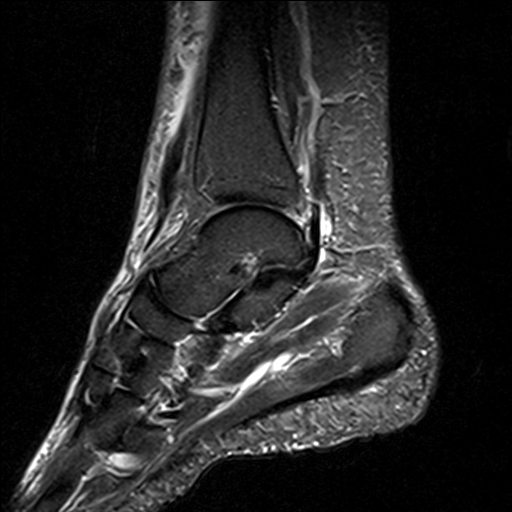
[im 17/22]
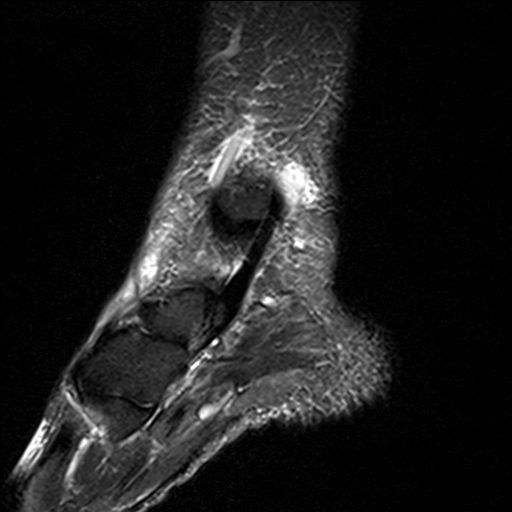
[im 22/22]
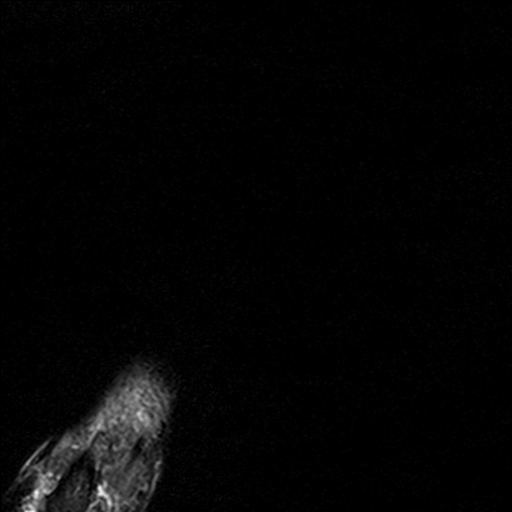

[Series 10: T2 fat-sat · coronal · 3.0mm · 0.50mm/px · 9 of 35 slices shown (2 of 2)]
[im 1/35]
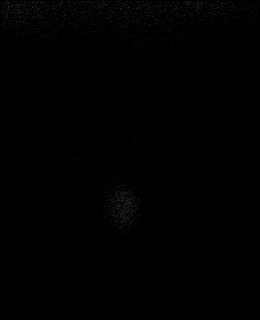
[im 4/35]
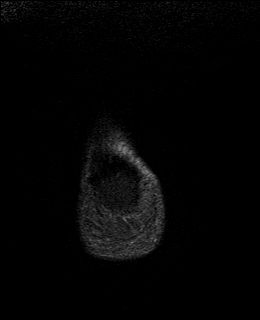
[im 8/35]
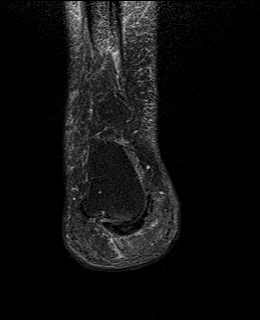
[im 12/35]
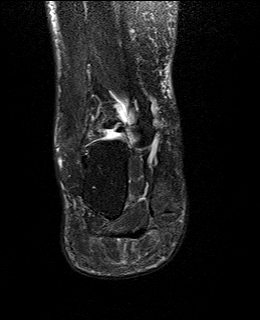
[im 16/35]
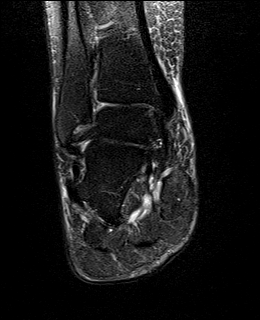
[im 19/35]
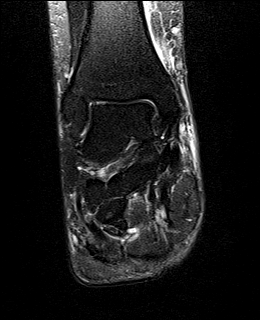
[im 23/35]
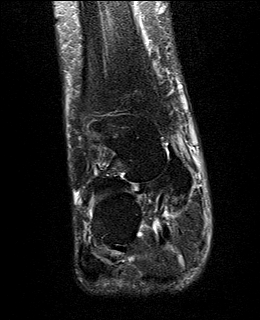
[im 31/35]
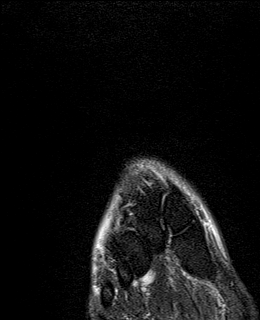
[im 35/35]
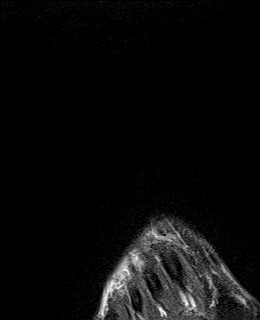

[39 of 40 positions shown; findings below may reference images not displayed]

FINDINGS: TENDONS

Peroneal: Intact

Posteromedial: Intact

Anterior: The anterior tibialis tendon is torn and retracted. The
retracted tendon is along the anterior aspect of the talus. The
other anterior ankle tendons are intact.

Achilles: Intact.

Plantar Fascia: Intact.

LIGAMENTS

Lateral: Intact

Medial: Intact

CARTILAGE

Ankle Joint: Mild degenerative changes with areas of cartilage
thinning but no full-thickness cartilage defects or osteochondral
lesion. Small joint effusion.

Subtalar Joints/Sinus Tarsi: No significant degenerative changes.
The sinus tarsi is unremarkable. The cervical and interosseous
ligaments are intact.

Bones: No bone contusions, marrow edema or osteochondral lesions. No
stress fracture. Mild midfoot degenerative changes.

Other: Unremarkable foot and ankle musculature.
IMPRESSION: 1. Torn and retracted anterior tibialis tendon.
2. Intact medial and lateral ankle ligaments and tendons.
3. Mild tibiotalar and midfoot degenerative changes. No stress
fracture or osteochondral lesions.

## 2023-07-07 ENCOUNTER — Ambulatory Visit (INDEPENDENT_AMBULATORY_CARE_PROVIDER_SITE_OTHER): Payer: Self-pay | Admitting: *Deleted

## 2023-07-07 DIAGNOSIS — J309 Allergic rhinitis, unspecified: Secondary | ICD-10-CM | POA: Diagnosis not present

## 2023-07-15 ENCOUNTER — Ambulatory Visit: Payer: Medicare PPO | Admitting: Neurology

## 2023-08-11 ENCOUNTER — Ambulatory Visit (INDEPENDENT_AMBULATORY_CARE_PROVIDER_SITE_OTHER): Payer: Medicare PPO

## 2023-08-11 DIAGNOSIS — J309 Allergic rhinitis, unspecified: Secondary | ICD-10-CM | POA: Diagnosis not present

## 2023-09-14 ENCOUNTER — Ambulatory Visit (INDEPENDENT_AMBULATORY_CARE_PROVIDER_SITE_OTHER): Payer: Self-pay | Admitting: *Deleted

## 2023-09-14 DIAGNOSIS — J309 Allergic rhinitis, unspecified: Secondary | ICD-10-CM | POA: Diagnosis not present

## 2023-11-04 ENCOUNTER — Ambulatory Visit (INDEPENDENT_AMBULATORY_CARE_PROVIDER_SITE_OTHER): Payer: Self-pay

## 2023-11-04 DIAGNOSIS — J309 Allergic rhinitis, unspecified: Secondary | ICD-10-CM

## 2023-11-26 ENCOUNTER — Ambulatory Visit (INDEPENDENT_AMBULATORY_CARE_PROVIDER_SITE_OTHER): Admitting: *Deleted

## 2023-11-26 DIAGNOSIS — J309 Allergic rhinitis, unspecified: Secondary | ICD-10-CM

## 2023-12-14 ENCOUNTER — Ambulatory Visit (INDEPENDENT_AMBULATORY_CARE_PROVIDER_SITE_OTHER): Payer: Self-pay | Admitting: *Deleted

## 2023-12-14 DIAGNOSIS — J309 Allergic rhinitis, unspecified: Secondary | ICD-10-CM

## 2024-01-14 ENCOUNTER — Ambulatory Visit (INDEPENDENT_AMBULATORY_CARE_PROVIDER_SITE_OTHER): Payer: Self-pay

## 2024-01-14 DIAGNOSIS — J309 Allergic rhinitis, unspecified: Secondary | ICD-10-CM | POA: Diagnosis not present

## 2024-01-22 ENCOUNTER — Ambulatory Visit (INDEPENDENT_AMBULATORY_CARE_PROVIDER_SITE_OTHER)

## 2024-01-22 ENCOUNTER — Ambulatory Visit: Admitting: Podiatry

## 2024-01-22 DIAGNOSIS — M21961 Unspecified acquired deformity of right lower leg: Secondary | ICD-10-CM | POA: Diagnosis not present

## 2024-01-22 DIAGNOSIS — M2042 Other hammer toe(s) (acquired), left foot: Secondary | ICD-10-CM

## 2024-01-22 DIAGNOSIS — M2041 Other hammer toe(s) (acquired), right foot: Secondary | ICD-10-CM

## 2024-01-22 NOTE — Progress Notes (Unsigned)
 Chief Complaint  Patient presents with   Hammer Toe    Bilateral hammertoes. The right foot seems to be the worst. They have gotten worse and are now affecting balance and walking. Her Orthopedic told her there was nothing that can be done about hammertoes. She has a HX of SX on the right foot tendon. She feels like the previous SX is drawing her does more. She has tried separators and I think met pads is what she is describing. Not diabetic and takes ASA 81   HPI: 78 y.o. female presents today with concern of hammertoes on both feet.  She states the right is worse than the left.  She would like to discuss fixing these.  She had tried stretching out her tendons in the past and states that this did not improve the situation.  Patient experienced a tear of her anterior tibial tendon on the right foot.  She states ever since that was repaired by an orthopedic surgeon, her right foot is now shorter and appears more deformed than the left.  She also has secondary concern of fungal nails  Past Medical History:  Diagnosis Date   ADD (attention deficit disorder)    Allergic rhinitis    Arthritis    Depression    Hyperlipidemia    Hypertension    Melanoma of upper arm, right (HCC)    Pre-diabetes    Squamous cell carcinoma of lip    Past Surgical History:  Procedure Laterality Date   ACHILLES TENDON LENGTHENING Right 06/13/2021   Procedure: Right Achilles tendon lengthening;  Surgeon: Amada Backer, MD;  Location: [facility];  Service: Orthopedics;  Laterality: Right;   bilateral joinit replacement on both thumbs      MELANOMA EXCISION Right    Arm   REVERSE SHOULDER ARTHROPLASTY Left 07/13/2020   Procedure: REVERSE SHOULDER ARTHROPLASTY;  Surgeon: Winston Hawking, MD;  Location: [facility];  Service: Orthopedics;  Laterality: Left;   right knee arthroscopy      squamous cell carcinoma removal  12/2022   from lip   TENDON REPAIR Right 06/13/2021   Procedure: Tibialis anterior  tendon reconstruction;  Surgeon: Amada Backer, MD;  Location: [facility];  Service: Orthopedics;  Laterality: Right;   TONSILLECTOMY     Allergies  Allergen Reactions   Penicillins Swelling, Rash and Other (See Comments)    Mouth/tongue    Physical Exam: Palpable pedal pulses noted.  No edema is appreciated in the feet.  There are flexible contractures of the lesser toes bilateral with the right toes being more pronounced in their contracture than the left.  There is also a flexible hallux malleus deformity on the right.  Patient has a mild cavovarus foot type on the right.  There is a flexible plantarflexed first metatarsal on the right.  Radiographic Exam(bilateral foot, 3 weightbearing views, 01/22/2024):  Right foot:  normal osseous mineralization. No fractures noted.  Increased calcaneal inclination angle noted.  There is a circular radiolucency in the proximal portion of the medial cuneiform which may be where the anterior tibial tendon was directly repaired and routed through/sutured into the bone.  There is uneven joint space narrowing at the hallux IPJ with spurring along the dorsal medial aspect of the joint.  There is slightly plantarflexed first metatarsal noted on lateral view.  There is medial angulation of the 2nd and 3rd toes at the level of the MP joint with contracture at the PIP joint.  There is adductovarus  rotation of the fourth toe and varus rotation of the fifth toe noted on x-ray.  This correlates clinically.  Bipartite tibial sesamoid Left foot: Mild contracture of toes 2 through 5.  Normal osseous mineralization.  No fracture seen  Assessment/Plan of Care: 1. Hammertoes of both feet   2. Acquired foot deformity, right     Reviewed clinical and radiographic findings with the patient today.  Informed the patient that it appears her tear of the tibialis anterior tendon might have been repaired into the medial cuneiform without any attachment to the first  metatarsal base.  This could result in postop plantarflexion of the first metatarsal, as the peroneus longus is unopposed.  This will make this foot look shorter and more deformed and could result in worsening hammertoe deformities on the right foot.  Recommended shaving of the bony prominence at the hallux dorsal medial IPJ, and hammertoe correction of toes 2 through 5.  Some of these (toes 2 and 3) may require percutaneous pins/K wires.  Informed the patient that we will need to discuss the fungal nails at a future appointment as significant time was taken today for the hammertoe discussion and evaluation.  Patient was fitted for a toe alignment splint and instructed to use this when sleeping or when resting while nonweightbearing for the next 30 days.  Will follow-up in 4 to 6 weeks to recheck the status of the toes and see if she would like to proceed with surgical intervention after our discussion today.  She was informed that repair of all of these toes would be a 4 to 6-week recovery course as far as bone healing and possible return to shoes.  She may still experience swelling for several months afterwards while the area is continued to heal and remodel.  Would recommend custom orthotics in the future to support the slightly different foot appearance bilateral.   Kaelem Brach DEstle Hemp, DPM, FACFAS [facility]     [address] [place], [place] [address]                Office [phone]  Fax [phone]

## 2024-02-26 ENCOUNTER — Ambulatory Visit: Admitting: Podiatry

## 2024-02-26 DIAGNOSIS — M2041 Other hammer toe(s) (acquired), right foot: Secondary | ICD-10-CM | POA: Diagnosis not present

## 2024-02-26 DIAGNOSIS — M216X1 Other acquired deformities of right foot: Secondary | ICD-10-CM

## 2024-02-26 NOTE — Progress Notes (Unsigned)
 Chief Complaint  Patient presents with   Hammer Toe    Discuss Hammertoes and nail fungus. She really wants to avoid major SX if possible. Not diabetic and takes ASA    HPI: 78 y.o. female presents today to discuss surgical correction of the hammertoes on the right foot.  Interested in having toes 2, 3, 4, and 5 repaired.  She had a tibialis anterior tendon repair secondary to rupture of the tendon in the past.  It does seem that the tendon may have been anchored into the medial cuneiform, because the patient now has plantarflexed first metatarsal and a slight cavovarus foot deformity.  This was discovered on last visit.  She does have pain underneath the first metatarsal head.  Denies ulceration.  Past Medical History:  Diagnosis Date   ADD (attention deficit disorder)    Allergic rhinitis    Arthritis    Depression    Hyperlipidemia    Hypertension    Melanoma of upper arm, right (HCC)    Pre-diabetes    Squamous cell carcinoma of lip    Past Surgical History:  Procedure Laterality Date   ACHILLES TENDON LENGTHENING Right 06/13/2021   Procedure: Right Achilles tendon lengthening;  Surgeon: Amada Backer, MD;  Location: [facility];  Service: Orthopedics;  Laterality: Right;   bilateral joinit replacement on both thumbs      MELANOMA EXCISION Right    Arm   REVERSE SHOULDER ARTHROPLASTY Left 07/13/2020   Procedure: REVERSE SHOULDER ARTHROPLASTY;  Surgeon: Winston Hawking, MD;  Location: [facility];  Service: Orthopedics;  Laterality: Left;   right knee arthroscopy      squamous cell carcinoma removal  12/2022   from lip   TENDON REPAIR Right 06/13/2021   Procedure: Tibialis anterior tendon reconstruction;  Surgeon: Amada Backer, MD;  [facility];  Service: Orthopedics;  Laterality: Right;   TONSILLECTOMY     Allergies  Allergen Reactions   Penicillins Swelling, Rash and Other (See Comments)    Mouth/tongue    Physical Exam: Palpable pedal  pulses noted.  There are semirigid contractures of the right 2nd and 3rd toes of PIPJ's.  There is flexible contracture of the 4th and 5th toes at the PIPJ with some adductovarus deformity present.  There is a plantarflexed first metatarsal which is reducible with manipulation.  There is slight cavovarus position of the foot in resting calcaneal position.  No digital corns are present.  Assessment/Plan of Care: 1. Hammertoe of right foot   2. Cavovarus deformity of foot, acquired, right    Discussed findings with patient today.  Discussed hammertoe repair right 2nd and 3rd toes with the use of a K wire.  Also discussed hammertoe repair of the right 4th and 5th toes but without pinning required.  However, I did discuss that she may be a candidate for arthrodesis of the first metatarsal-medial cuneiform, or Lapa plasty procedure he will elevate the first metatarsal which will help improve her cavovarus positioning secondary to the tibialis anterior tendon rupture repair.  She will think about this.  I informed her I did not perform the arthrodesis procedure and would have them of her other surgeons take this over.  They could also do the digital hammertoe repairs at the same time but she can discuss this with them if she is interested in proceeding with the overall correction.  Significant was time was taken to discuss the surgical options with the patient.  Also discussed location of procedure  and type of anesthesia and risks involved.  Recommend power step high arch for the right foot and regular arch for the left foot.  Also dispensed hammertoe regulator/toe alignment splints.  Follow-up as needed   Joe Murders, DPM, FACFAS [facility]     [address] [place], [place] [address]                Office [phone]  Fax [phone]

## 2024-03-10 ENCOUNTER — Ambulatory Visit (INDEPENDENT_AMBULATORY_CARE_PROVIDER_SITE_OTHER): Payer: Self-pay | Admitting: *Deleted

## 2024-03-10 DIAGNOSIS — J309 Allergic rhinitis, unspecified: Secondary | ICD-10-CM | POA: Diagnosis not present

## 2024-04-07 ENCOUNTER — Ambulatory Visit (INDEPENDENT_AMBULATORY_CARE_PROVIDER_SITE_OTHER): Payer: Self-pay | Admitting: *Deleted

## 2024-04-07 DIAGNOSIS — J309 Allergic rhinitis, unspecified: Secondary | ICD-10-CM | POA: Diagnosis not present

## 2024-05-12 ENCOUNTER — Ambulatory Visit (INDEPENDENT_AMBULATORY_CARE_PROVIDER_SITE_OTHER): Payer: Self-pay | Admitting: *Deleted

## 2024-05-12 DIAGNOSIS — J309 Allergic rhinitis, unspecified: Secondary | ICD-10-CM | POA: Diagnosis not present

## 2024-06-07 NOTE — Progress Notes (Signed)
 VIALS MADE 06-07-24

## 2024-06-09 ENCOUNTER — Ambulatory Visit (INDEPENDENT_AMBULATORY_CARE_PROVIDER_SITE_OTHER)

## 2024-06-09 DIAGNOSIS — J309 Allergic rhinitis, unspecified: Secondary | ICD-10-CM | POA: Diagnosis not present

## 2024-06-10 DIAGNOSIS — J302 Other seasonal allergic rhinitis: Secondary | ICD-10-CM | POA: Diagnosis not present

## 2024-06-13 DIAGNOSIS — J301 Allergic rhinitis due to pollen: Secondary | ICD-10-CM | POA: Diagnosis not present

## 2024-06-13 DIAGNOSIS — J3089 Other allergic rhinitis: Secondary | ICD-10-CM | POA: Diagnosis not present

## 2024-06-29 ENCOUNTER — Ambulatory Visit (INDEPENDENT_AMBULATORY_CARE_PROVIDER_SITE_OTHER): Payer: Self-pay

## 2024-06-29 DIAGNOSIS — J309 Allergic rhinitis, unspecified: Secondary | ICD-10-CM | POA: Diagnosis not present

## 2024-07-07 ENCOUNTER — Ambulatory Visit: Admitting: Podiatry

## 2024-07-07 DIAGNOSIS — M21611 Bunion of right foot: Secondary | ICD-10-CM

## 2024-07-07 DIAGNOSIS — M216X1 Other acquired deformities of right foot: Secondary | ICD-10-CM

## 2024-07-07 DIAGNOSIS — M2041 Other hammer toe(s) (acquired), right foot: Secondary | ICD-10-CM

## 2024-07-07 DIAGNOSIS — M21961 Unspecified acquired deformity of right lower leg: Secondary | ICD-10-CM

## 2024-07-07 NOTE — Progress Notes (Signed)
 Chief Complaint  Patient presents with   SX Consult    Here today to get encouraged to see the referred doctor to do the SX that is needed.    HPI: 78 y.o. female presents today, requesting another preoperative discussion before she scheduled with one of our other surgeons.  On her last visit, it was recommended that she get scheduled with some of our rearfoot/reconstructive surgeons within our practice.  She had a previous tibialis anterior tendon rupture which was repaired by orthopedics.  Since then, she has noticed an increased arch on the right foot.  She now feels that she is getting some pain along the lateral ankle on the right when walking, especially with prolonged walking.  She also has hammertoes present.  These cause pain.  She states that at this point, all shoes are uncomfortable and she is starting to avoid certain activities due to expected pain.  She is wearing the power step high arch inserts in the right shoe only since it has a much higher arch than the left.  She feels that it provided minimal improvement.  She also notes her last DEXA scan was 10 years ago.  She has a written list of questions today.  She is looking for a surgeon that we will sit and discuss all of her options with her like we do during her visits here.  Past Medical History:  Diagnosis Date   ADD (attention deficit disorder)    Allergic rhinitis    Arthritis    Depression    Hyperlipidemia    Hypertension    Melanoma of upper arm, right (HCC)    Pre-diabetes    Squamous cell carcinoma of lip    Past Surgical History:  Procedure Laterality Date   ACHILLES TENDON LENGTHENING Right 06/13/2021   Procedure: Right Achilles tendon lengthening;  Surgeon: Kit Rush, MD;  Location: [facility];  Service: Orthopedics;  Laterality: Right;   bilateral joinit replacement on both thumbs      MELANOMA EXCISION Right    Arm   REVERSE SHOULDER ARTHROPLASTY Left 07/13/2020   Procedure: REVERSE  SHOULDER ARTHROPLASTY;  Surgeon: Kay Kemps, MD;  Location: [facility];  Service: Orthopedics;  Laterality: Left;   right knee arthroscopy      squamous cell carcinoma removal  12/2022   from lip   TENDON REPAIR Right 06/13/2021   Procedure: Tibialis anterior tendon reconstruction;  Surgeon: Kit Rush, MD;  Location: [facility];  Service: Orthopedics;  Laterality: Right;   TONSILLECTOMY     Allergies  Allergen Reactions   Penicillins Other (See Comments), Rash and Swelling    Mouth/tongue    Physical Exam: Palpable pedal pulses.  There is a semirigid plantarflexed first metatarsal on the right foot.  The peroneus longus tendon appears to be unopposed at this time.  Minimal strength of the anterior tibial tendon with elevation of the first metatarsal on the right foot.  Slight varus position of the right foot when nonweightbearing as compared to the left.  Higher arch on the right foot compared to the left and resting calcaneal stance position.  Contracture of toes 2 through 5 on the right foot at the PIP joint level.  Minimal discomfort to the peroneal tendons at the right ankle.  No pain on palpation to the fifth metatarsal base.  No ecchymosis or erythema is appreciated.  No edema is appreciated.  Epicritic sensation is intact.  Assessment/Plan of Care: 1. Cavovarus deformity of foot, acquired,  right   2. Hammertoe of right foot   3. Acquired foot deformity, right   4. Bunion, right foot     Discussed findings with the patient today.  Also reviewed her previous foot x-rays once again with her today.  I informed the patient that the anterior tibial tendon repair most likely resulted in the tendon being anchored into the medial cuneiform.  This is causing a varus position of the foot but also is leaving the peroneus longus tendon unopposed.  With the first metatarsal and its plantarflexed position, during gait, the first metatarsal is most likely pushing her foot outward and  putting strain on the peroneal tendons on the right side.  This is most likely why she is having the lateral ankle pain.  Discussed the need for her to be evaluated by one of our surgeons who is well versed  in these types of midfoot procedures.  Informed the patient that simple bunion correction would be unsuccessful as she should undergo some type of procedure at the first metatarsal-medial cuneiform joint/arthrodesis/Lapiplasty to correct the plantarflexed and medially deviated first metatarsal.  This would correct  the deformity in all 3 planes.  She can then also undergo the hammertoe corrections at the same time.  However, I informed the patient that every surgeon has different preferred procedures for different deformities.  She should save these questions for one of our other surgeons.  Will have her get scheduled with Dr. Silva for surgical consult.  Discussed typical postoperative course for these types of procedures as well as risks involved with undergoing surgery and anesthesia.  Informed the patient that he may order a new DEXA scan and vitamin D levels since her previous one was at least 10 years ago according to the patient.  She is a non-smoker.  She is prediabetic.  Last A1c on 06/14/2024 was 5.6  Significant time was taken to speak to the patient today, perform an examination, review previous x-rays with the patient and previous lab results, discuss possible ordering of new tests, discuss benefits and risks of undergoing surgery and anesthesia.   Awanda CHARM Imperial, DPM, FACFAS [facility]     [address] [place], [place] [address]                Office [phone]  Fax [phone]

## 2024-07-18 ENCOUNTER — Ambulatory Visit: Payer: Self-pay | Admitting: *Deleted

## 2024-07-18 DIAGNOSIS — J3089 Other allergic rhinitis: Secondary | ICD-10-CM | POA: Diagnosis not present

## 2024-07-18 DIAGNOSIS — J309 Allergic rhinitis, unspecified: Secondary | ICD-10-CM | POA: Diagnosis not present

## 2024-07-28 ENCOUNTER — Ambulatory Visit (INDEPENDENT_AMBULATORY_CARE_PROVIDER_SITE_OTHER)

## 2024-07-28 ENCOUNTER — Ambulatory Visit: Admitting: Podiatry

## 2024-07-28 VITALS — Ht 61.6 in | Wt 186.2 lb

## 2024-07-28 DIAGNOSIS — M216X1 Other acquired deformities of right foot: Secondary | ICD-10-CM

## 2024-07-28 DIAGNOSIS — S86219A Strain of muscle(s) and tendon(s) of anterior muscle group at lower leg level, unspecified leg, initial encounter: Secondary | ICD-10-CM

## 2024-07-28 DIAGNOSIS — S86311A Strain of muscle(s) and tendon(s) of peroneal muscle group at lower leg level, right leg, initial encounter: Secondary | ICD-10-CM | POA: Diagnosis not present

## 2024-07-28 NOTE — Patient Instructions (Signed)

## 2024-07-31 NOTE — Progress Notes (Signed)
 Subjective:  Patient ID: Tricia Hayes, female    DOB: 1946/06/27,  MRN: [id number]  Chief Complaint  Patient presents with   Foot Problem    Rm 15 Patient is here for a surgical consult.    Discussed the use of AI scribe software for clinical note transcription with the patient, who gave verbal consent to proceed.  History of Present Illness Tricia Hayes is a 78 year old female with a history of tibialis tendon tear who presents with worsening foot pain and deformity. She was referred by Dr. JINNY for evaluation of her foot pain and deformity.  Three years ago, she underwent surgery to repair a torn tibialis tendon. Since the surgery, her foot condition has progressively worsened. Her foot appears shorter than the other, and her toes have developed a hammer-like appearance, which she attributes to a familial tendency. She experiences significant pain in the ball of her foot, especially after prolonged standing or walking. Recently, she has developed pain in her ankle, particularly on the outside, which intensifies by the end of the day and is described as a burning sensation.  She has experienced a few falls, which she attributes to balance issues caused by her toes not splaying adequately. Her toes' condition affects her balance, making her more prone to toppling over when moving forward or to the right. She underwent extensive physical therapy following her surgery. She has experienced a few falls, though none were severe.  Her foot sometimes scrapes the ground, particularly on hard surfaces like concrete or hardwood floors, which she likens to the sound of an old dog's claws. She keeps her toenails cut short to manage this issue. She uses toe separators and a night splint to manage her symptoms, although she feels the splint may be causing additional ankle discomfort.  Her past medical history includes scoliosis with a 16-degree curvature and osteoarthritis in her spine, leading to chronic  lower back pain, predominantly on the right side. No sciatica or shooting pain down her legs. Her family history includes her father having foot issues requiring braces and her mother having osteoarthritis in her back. There is no known family history of Charcot-Marie-Tooth disease.      Objective:    Physical Exam VASCULAR: DP and PT pulse palpable. Foot is warm and well-perfused. Capillary fill time is brisk. DERMATOLOGIC: Normal skin turgor, texture, and temperature. No open lesions, rashes, or ulcerations. NEUROLOGIC: Normal sensation to light touch and pressure. No paresthesias on examination. ORTHOPEDIC: Pes cavus foot type with 4+/5 strength in dorsiflexion, 5/5 in plantar flexion, inversion, and eversion. Pain and tenderness over the peroneal tendons laterally. No pain on plantar flexion or other foot maneuvers. Good strength on dorsiflexion, eversion, and inversion. S    No images are attached to the encounter.    Results RADIOLOGY Radiographs: Pes cavus foot type with hindfoot varus, intact anchor site of medial cuneiform. (07/28/2024)   Assessment:   1. Cavovarus deformity of foot, acquired, right      Plan:  Patient was evaluated and treated and all questions answered.  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 Increased arch height and functional shortening of the foot. Pain and burning in the ball of the foot and lateral ankle, exacerbated by prolonged standing. Hammer toes affecting balance and causing falls. Differential includes Charcot-Marie-Tooth disease, but  less likely due to acute worsening post-surgery. - Ordered MRI of the right ankle to assess tendon repair and peroneal tendons. - Will discuss surgical options post-MRI, including realignment and joint fusion, depending on MRI  findings. - Advised use of toe separators and night splint for hammer toes. - Recommended supportive footwear, such as Birkin socks, for stability.  Right peroneal tendon pain and possible tendinopathy Pain and tenderness over the peroneal tendons laterally, likely due to high arch foot causing overload on the lateral side. Possible tendinopathy contributing to pain and instability. - Ordered MRI of the right ankle to evaluate peroneal tendons for inflammation or tears. - Will consider surgical intervention based on MRI findings to realign and rebalance the foot.  History of right tibialis anterior tendon rupture and repair Right tibialis anterior tendon rupture and repair three years ago. Current issues likely related to altered tendon function and muscle imbalance post-repair. MRI needed to assess the integrity of the repair and tendon function. - Ordered MRI of the right ankle to evaluate the integrity of the tibialis anterior tendon repair. - Will discuss potential surgical options to address tendon imbalance and realignment based on MRI findings.  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 Pain is dominant on the right side between the spine and hip. Previous injections were not well tolerated, and she has not pursued further specialist care.      Return for after MRI to review.

## 2024-08-03 ENCOUNTER — Ambulatory Visit (INDEPENDENT_AMBULATORY_CARE_PROVIDER_SITE_OTHER): Payer: Self-pay

## 2024-08-03 DIAGNOSIS — J309 Allergic rhinitis, unspecified: Secondary | ICD-10-CM

## 2024-08-03 DIAGNOSIS — J3089 Other allergic rhinitis: Secondary | ICD-10-CM

## 2024-08-15 ENCOUNTER — Encounter: Payer: Self-pay | Admitting: Podiatry

## 2024-08-30 ENCOUNTER — Inpatient Hospital Stay: Admission: RE | Admit: 2024-08-30 | Discharge: 2024-08-30 | Attending: Podiatry

## 2024-08-30 DIAGNOSIS — S86219A Strain of muscle(s) and tendon(s) of anterior muscle group at lower leg level, unspecified leg, initial encounter: Secondary | ICD-10-CM

## 2024-08-30 DIAGNOSIS — S86311A Strain of muscle(s) and tendon(s) of peroneal muscle group at lower leg level, right leg, initial encounter: Secondary | ICD-10-CM

## 2024-08-30 DIAGNOSIS — M216X1 Other acquired deformities of right foot: Secondary | ICD-10-CM

## 2024-09-12 ENCOUNTER — Ambulatory Visit: Payer: Self-pay | Admitting: Podiatry

## 2024-09-22 ENCOUNTER — Ambulatory Visit (INDEPENDENT_AMBULATORY_CARE_PROVIDER_SITE_OTHER): Admitting: *Deleted

## 2024-09-22 DIAGNOSIS — J302 Other seasonal allergic rhinitis: Secondary | ICD-10-CM

## 2024-10-04 ENCOUNTER — Ambulatory Visit: Admitting: Podiatry

## 2024-10-04 VITALS — Ht 61.6 in | Wt 186.0 lb

## 2024-10-04 DIAGNOSIS — M7671 Peroneal tendinitis, right leg: Secondary | ICD-10-CM | POA: Diagnosis not present

## 2024-10-06 NOTE — Progress Notes (Signed)
 "  Subjective:  Patient ID: Tricia Hayes, female    DOB: 07-Oct-1945,  MRN: [id number]  Chief Complaint  Patient presents with   Foot Problem    Rm 1 Patient is here to review MRI results.    Discussed the use of AI scribe software for clinical note transcription with the patient, who gave verbal consent to proceed.  History of Present Illness Tricia Hayes is a 79 year old female with a history of tibialis tendon tear who presents with worsening foot pain and deformity. She was referred by Dr. McCaughan for evaluation of her foot pain and deformity.  Three years ago, she underwent surgery to repair a torn tibialis tendon. Since the surgery, her foot condition has progressively worsened. Her foot appears shorter than the other, and her toes have developed a hammer-like appearance, which she attributes to a familial tendency. She experiences significant pain in the ball of her foot, especially after prolonged standing or walking. Recently, she has developed pain in her ankle, particularly on the outside, which intensifies by the end of the day and is described as a burning sensation.  She has experienced a few falls, which she attributes to balance issues caused by her toes not splaying adequately. Her toes' condition affects her balance, making her more prone to toppling over when moving forward or to the right. She underwent extensive physical therapy following her surgery. She has experienced a few falls, though none were severe.  Her foot sometimes scrapes the ground, particularly on hard surfaces like concrete or hardwood floors, which she likens to the sound of an old dog's claws. She keeps her toenails cut short to manage this issue. She uses toe separators and a night splint to manage her symptoms, although she feels the splint may be causing additional ankle discomfort.  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 No sciatica or shooting pain down her legs. Her family history includes her father having foot issues requiring braces and her mother having osteoarthritis in her back. There is no known family history of Charcot-Marie-Tooth disease.   Interval history:  she returns for follow-up no change in symptoms completed the MRI.      Objective:    Physical Exam VASCULAR: DP and PT pulse palpable. Foot is warm and well-perfused. Capillary fill time is brisk. DERMATOLOGIC: Normal skin turgor, texture, and temperature. No open lesions, rashes, or ulcerations. NEUROLOGIC: Normal sensation to light touch and pressure. No paresthesias on examination. ORTHOPEDIC: Pes cavus foot type with 4+/5 strength in dorsiflexion, 5/5 in plantar flexion, inversion, and eversion. Pain and tenderness over the peroneal tendons laterally. No pain on plantar flexion or other foot maneuvers. Good strength on dorsiflexion, eversion, and inversion. S    Narrative & Impression  CLINICAL DATA:  Worsening right ankle pain   EXAM: MRI OF THE RIGHT ANKLE WITHOUT CONTRAST   TECHNIQUE: Multiplanar, multisequence MR imaging of the ankle was performed. No intravenous contrast was administered.   COMPARISON:  None Available.   FINDINGS: TENDONS   Peroneal: Mild tendinosis of the peroneus brevis. Mild tendinosis of the peroneus longus. Mild peroneal tenosynovitis.   Posteromedial: Posterior tibial tendon intact. Flexor hallucis longus tendon intact. Flexor digitorum longus tendon intact.   Anterior: Prior tibialis anterior repair with postsurgical changes. Extensor hallucis longus tendon intact Extensor digitorum longus tendon intact.   Achilles: Chronic low signal thickening of  the distal Achilles tendon consistent with chronic Achilles tendinosis. No acute abnormality.   Plantar Fascia: Intact.   LIGAMENTS   Lateral: Anterior talofibular ligament intact.  Calcaneofibular ligament intact. Posterior talofibular ligament intact. Anterior and posterior tibiofibular ligaments intact.   Medial: Deltoid ligament intact. Spring ligament intact.   CARTILAGE   Ankle Joint: No joint effusion. Small osteochondral lesion of the lateral corner of the talar dome measuring 5 x 6 mm with subchondral cystic changes and overlying mild partial-thickness cartilage loss.   Subtalar Joints/Sinus Tarsi: Normal subtalar joints. No subtalar joint effusion. Normal sinus tarsi.   Bones: No aggressive osseous lesion. No fracture or dislocation.   Soft Tissue: No fluid collection or hematoma. Muscles are normal without edema or atrophy. Tarsal tunnel is normal.   IMPRESSION: 1. Mild tendinosis of the peroneus brevis. Mild tendinosis of the peroneus longus. Mild peroneal tenosynovitis. 2. Prior tibialis anterior repair with postsurgical changes. 3. Chronic low signal thickening of the distal Achilles tendon consistent with chronic Achilles tendinosis. No acute abnormality. 4. Small osteochondral lesion of the lateral corner of the talar dome measuring 5 x 6 mm with subchondral cystic changes and overlying mild partial-thickness cartilage loss.     Electronically Signed   By: Julaine Blanch M.D.   On: 08/30/2024 13:40      Results RADIOLOGY Radiographs: Pes cavus foot type with hindfoot varus, intact anchor site of medial cuneiform. (07/28/2024)   Assessment:   1. Peroneal tendinitis, right       Plan:  Patient was evaluated and treated and all questions answered.  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 Increased arch height and functional shortening of the foot. Pain and burning in the ball of the foot and lateral ankle, exacerbated by prolonged standing. Hammer  toes affecting balance and causing falls.   - Reviewed the results of her MRI, discussed the tendinosis of the peroneal tendons there is no acute tear and there is tenosynovitis with fluid signal from the peroneal tubercle to the retromalleolar groove.  We discussed treatment of the acute tenosynovitis we also discussed the long-term treatment and impact of the pes cavus deformity on her peroneal tendons.  Surgical from a reconstructive standpoint would need Dwyer osteotomy first metatarsal DFWO, peroneal tendon transfer and repair and plantar fasciotomy.  Forefoot deformities and hammertoes will need to be addressed separately with staged approach.  Discussed this is a fairly extensive surgery and reconstructive effort and recovery time.  We also discussed nonoperative treatment with injection therapy for the acute tenosynovitis boot immobilization to allow the tendon to rest formal physical therapy and long-term bracing.  She elected for the latter.  Following consent the lateral hindfoot was prepped with alcohol  and the peroneal tendon sheath was injected with 4 mg of dexamethasone  phosphate and 5 mg of Marcaine  at the level of the peroneal tubercle.  She tolerated this well.  She would like to have physical therapy at [facility], she is going to check to see if they are in network with her insurance and notify me for referral to be sent.  Follow-up with me in 1 month.  CAM boot dispensed.      Return in about 1 month (around 11/04/2024) for f/u on peroneal tendons.   "

## 2024-11-01 ENCOUNTER — Ambulatory Visit: Admitting: Podiatry
